# Patient Record
Sex: Female | Born: 1990
Health system: Southern US, Community
[De-identification: ages and names within clinical notes are randomized; demographics above are authoritative.]

## PROBLEM LIST (undated history)

## (undated) ENCOUNTER — Inpatient Hospital Stay (HOSPITAL_COMMUNITY): Payer: Self-pay

## (undated) DIAGNOSIS — F1910 Other psychoactive substance abuse, uncomplicated: Secondary | ICD-10-CM

## (undated) DIAGNOSIS — F1911 Other psychoactive substance abuse, in remission: Secondary | ICD-10-CM

## (undated) DIAGNOSIS — Z8489 Family history of other specified conditions: Secondary | ICD-10-CM

## (undated) DIAGNOSIS — N200 Calculus of kidney: Secondary | ICD-10-CM

## (undated) DIAGNOSIS — Z8614 Personal history of Methicillin resistant Staphylococcus aureus infection: Secondary | ICD-10-CM

## (undated) DIAGNOSIS — M21959 Unspecified acquired deformity of unspecified thigh: Secondary | ICD-10-CM

## (undated) HISTORY — PX: TONSILLECTOMY AND ADENOIDECTOMY: SHX28

## (undated) HISTORY — DX: Other psychoactive substance abuse, uncomplicated: F19.10

---
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
## 2012-12-23 DIAGNOSIS — M21959 Unspecified acquired deformity of unspecified thigh: Secondary | ICD-10-CM

## 2012-12-23 HISTORY — DX: Unspecified acquired deformity of unspecified thigh: M21.959

## 2012-12-25 ENCOUNTER — Encounter (HOSPITAL_BASED_OUTPATIENT_CLINIC_OR_DEPARTMENT_OTHER): Payer: Self-pay | Admitting: *Deleted

## 2013-01-01 ENCOUNTER — Encounter (HOSPITAL_BASED_OUTPATIENT_CLINIC_OR_DEPARTMENT_OTHER)
Admission: RE | Disposition: A | Discharge status: Home / Self Care | Payer: Self-pay | Source: Ambulatory Visit | Attending: Specialist

## 2013-01-01 ENCOUNTER — Encounter (HOSPITAL_BASED_OUTPATIENT_CLINIC_OR_DEPARTMENT_OTHER): Payer: BC Managed Care – PPO | Admitting: Anesthesiology

## 2013-01-01 ENCOUNTER — Ambulatory Visit (HOSPITAL_BASED_OUTPATIENT_CLINIC_OR_DEPARTMENT_OTHER): Payer: BC Managed Care – PPO | Admitting: Anesthesiology

## 2013-01-01 ENCOUNTER — Encounter (HOSPITAL_BASED_OUTPATIENT_CLINIC_OR_DEPARTMENT_OTHER): Payer: Self-pay | Admitting: Anesthesiology

## 2013-01-01 ENCOUNTER — Ambulatory Visit (HOSPITAL_BASED_OUTPATIENT_CLINIC_OR_DEPARTMENT_OTHER)
Admission: RE | Admit: 2013-01-01 | Discharge: 2013-01-01 | Disposition: A | Discharge status: Home / Self Care | Payer: BC Managed Care – PPO | Source: Ambulatory Visit | Attending: Specialist | Admitting: Specialist

## 2013-01-01 DIAGNOSIS — F1910 Other psychoactive substance abuse, uncomplicated: Secondary | ICD-10-CM | POA: Insufficient documentation

## 2013-01-01 DIAGNOSIS — L988 Other specified disorders of the skin and subcutaneous tissue: Secondary | ICD-10-CM | POA: Insufficient documentation

## 2013-01-01 DIAGNOSIS — Z8614 Personal history of Methicillin resistant Staphylococcus aureus infection: Secondary | ICD-10-CM | POA: Insufficient documentation

## 2013-01-01 HISTORY — DX: Other psychoactive substance abuse, in remission: F19.11

## 2013-01-01 HISTORY — DX: Unspecified acquired deformity of unspecified thigh: M21.959

## 2013-01-01 HISTORY — PX: LIPOSUCTION: SHX10

## 2013-01-01 HISTORY — DX: Family history of other specified conditions: Z84.89

## 2013-01-01 HISTORY — DX: Personal history of Methicillin resistant Staphylococcus aureus infection: Z86.14

## 2013-01-01 SURGERY — LIPOSUCTION
Anesthesia: General | Site: Hip | Laterality: Left | Wound class: Clean

## 2013-01-01 MED ORDER — MIDAZOLAM HCL 2 MG/2ML IJ SOLN
INTRAMUSCULAR | Status: AC
  Filled 2013-01-01: qty 2

## 2013-01-01 MED ORDER — LIDOCAINE-EPINEPHRINE 0.5 %-1:200000 IJ SOLN
INTRAMUSCULAR | Status: AC
  Filled 2013-01-01: qty 2

## 2013-01-01 MED ORDER — LIDOCAINE HCL (PF) 1 % IJ SOLN
INTRAMUSCULAR | Status: AC
  Filled 2013-01-01: qty 30

## 2013-01-01 MED ORDER — ONDANSETRON HCL 4 MG/2ML IJ SOLN: 4.0000 mg | Freq: Once | INTRAMUSCULAR | Status: DC | PRN

## 2013-01-01 MED ORDER — PROPOFOL 10 MG/ML IV BOLUS
INTRAVENOUS | Status: DC | PRN
  Administered 2013-01-01: 200 mg via INTRAVENOUS

## 2013-01-01 MED ORDER — LIDOCAINE-EPINEPHRINE 0.5 %-1:200000 IJ SOLN
INTRAMUSCULAR | Status: DC | PRN
  Administered 2013-01-01: 100 mL

## 2013-01-01 MED ORDER — LIDOCAINE HCL (PF) 0.5 % IJ SOLN
INTRAMUSCULAR | Status: AC
  Filled 2013-01-01: qty 50

## 2013-01-01 MED ORDER — FENTANYL CITRATE 0.05 MG/ML IJ SOLN: 50.0000 ug | INTRAMUSCULAR | Status: DC | PRN

## 2013-01-01 MED ORDER — DEXAMETHASONE SODIUM PHOSPHATE 4 MG/ML IJ SOLN
INTRAMUSCULAR | Status: DC | PRN
  Administered 2013-01-01: 10 mg via INTRAVENOUS

## 2013-01-01 MED ORDER — ONDANSETRON HCL 4 MG/2ML IJ SOLN
INTRAMUSCULAR | Status: DC | PRN
  Administered 2013-01-01: 4 mg via INTRAVENOUS

## 2013-01-01 MED ORDER — SODIUM CHLORIDE 0.9 % IV SOLN
INTRAVENOUS | Status: DC | PRN
  Administered 2013-01-01: 200 mL via INTRAMUSCULAR

## 2013-01-01 MED ORDER — MIDAZOLAM HCL 2 MG/ML PO SYRP: 12.0000 mg | ORAL_SOLUTION | Freq: Once | ORAL | Status: DC | PRN

## 2013-01-01 MED ORDER — LACTATED RINGERS IV SOLN
INTRAVENOUS | Status: DC
  Administered 2013-01-01: 07:00:00 via INTRAVENOUS

## 2013-01-01 MED ORDER — ACETAMINOPHEN 325 MG PO TABS
975.0000 mg | ORAL_TABLET | Freq: Once | ORAL | Status: AC
  Administered 2013-01-01: 975 mg via ORAL

## 2013-01-01 MED ORDER — MIDAZOLAM HCL 5 MG/5ML IJ SOLN
INTRAMUSCULAR | Status: DC | PRN
  Administered 2013-01-01: 2 mg via INTRAVENOUS

## 2013-01-01 MED ORDER — FENTANYL CITRATE 0.05 MG/ML IJ SOLN
INTRAMUSCULAR | Status: DC | PRN
  Administered 2013-01-01: 25 ug via INTRAVENOUS
  Administered 2013-01-01: 100 ug via INTRAVENOUS
  Administered 2013-01-01: 25 ug via INTRAVENOUS

## 2013-01-01 MED ORDER — CEFAZOLIN SODIUM 1-5 GM-% IV SOLN
INTRAVENOUS | Status: AC
  Filled 2013-01-01: qty 100

## 2013-01-01 MED ORDER — HYDROMORPHONE HCL PF 1 MG/ML IJ SOLN: 0.2500 mg | INTRAMUSCULAR | Status: DC | PRN

## 2013-01-01 MED ORDER — OXYCODONE HCL 5 MG PO TABS: 5.0000 mg | ORAL_TABLET | Freq: Once | ORAL | Status: DC | PRN

## 2013-01-01 MED ORDER — FENTANYL CITRATE 0.05 MG/ML IJ SOLN
INTRAMUSCULAR | Status: AC
  Filled 2013-01-01: qty 4

## 2013-01-01 MED ORDER — MIDAZOLAM HCL 2 MG/2ML IJ SOLN: 1.0000 mg | INTRAMUSCULAR | Status: DC | PRN

## 2013-01-01 MED ORDER — LIDOCAINE HCL (CARDIAC) 20 MG/ML IV SOLN
INTRAVENOUS | Status: DC | PRN
  Administered 2013-01-01: 80 mg via INTRAVENOUS

## 2013-01-01 MED ORDER — ACETAMINOPHEN 325 MG PO TABS
ORAL_TABLET | ORAL | Status: AC
  Filled 2013-01-01: qty 3

## 2013-01-01 MED ORDER — OXYCODONE HCL 5 MG/5ML PO SOLN: 5.0000 mg | Freq: Once | ORAL | Status: DC | PRN

## 2013-01-01 MED ORDER — CEFAZOLIN SODIUM-DEXTROSE 2-3 GM-% IV SOLR
2.0000 g | INTRAVENOUS | Status: AC
  Administered 2013-01-01: 2 g via INTRAVENOUS

## 2013-01-01 SURGICAL SUPPLY — 61 items
BAG DECANTER FOR FLEXI CONT (MISCELLANEOUS) IMPLANT
BANDAGE GAUZE ELAST BULKY 4 IN (GAUZE/BANDAGES/DRESSINGS) IMPLANT
BENZOIN TINCTURE PRP APPL 2/3 (GAUZE/BANDAGES/DRESSINGS) IMPLANT
BINDER ABD UNIV 9 30-45 (GAUZE/BANDAGES/DRESSINGS) ×1 IMPLANT
BINDER ABDOMINAL 9 (GAUZE/BANDAGES/DRESSINGS) ×2
BLADE HEX COATED 2.75 (ELECTRODE) IMPLANT
BLADE KNIFE PERSONA 10 (BLADE) ×2 IMPLANT
BLADE KNIFE PERSONA 15 (BLADE) ×2 IMPLANT
BNDG COHESIVE 4X5 TAN STRL (GAUZE/BANDAGES/DRESSINGS) IMPLANT
CANISTER LINER 1300 C W/ELBOW (MISCELLANEOUS) ×2 IMPLANT
CANISTER LIPO FAT HARVEST (MISCELLANEOUS) ×2 IMPLANT
CANNULA ASPIRATION (CANNULA) ×2 IMPLANT
CLEANER CAUTERY TIP 5X5 PAD (MISCELLANEOUS) IMPLANT
COVER MAYO STAND STRL (DRAPES) ×4 IMPLANT
COVER TABLE BACK 60X90 (DRAPES) ×2 IMPLANT
DECANTER SPIKE VIAL GLASS SM (MISCELLANEOUS) IMPLANT
DRAPE PED LAPAROTOMY (DRAPES) IMPLANT
DRAPE U-SHAPE 76X120 STRL (DRAPES) IMPLANT
ELECT NEEDLE TIP 2.8 STRL (NEEDLE) IMPLANT
ELECT REM PT RETURN 9FT ADLT (ELECTROSURGICAL) ×2
ELECTRODE REM PT RTRN 9FT ADLT (ELECTROSURGICAL) ×1 IMPLANT
FILTER LIPOSUCTION (MISCELLANEOUS) ×2 IMPLANT
GAUZE SPONGE 4X4 12PLY STRL LF (GAUZE/BANDAGES/DRESSINGS) IMPLANT
GAUZE XEROFORM 1X8 LF (GAUZE/BANDAGES/DRESSINGS) IMPLANT
GLOVE ECLIPSE 7.0 STRL STRAW (GLOVE) ×2 IMPLANT
GOWN PREVENTION PLUS XLARGE (GOWN DISPOSABLE) IMPLANT
GOWN PREVENTION PLUS XXLARGE (GOWN DISPOSABLE) ×4 IMPLANT
IV LACTATED RINGERS 1000ML (IV SOLUTION) IMPLANT
IV NS 500ML (IV SOLUTION)
IV NS 500ML BAXH (IV SOLUTION) IMPLANT
NDL SAFETY ECLIPSE 18X1.5 (NEEDLE) IMPLANT
NEEDLE HYPO 18GX1.5 SHARP (NEEDLE)
NEEDLE HYPO 25X1 1.5 SAFETY (NEEDLE) IMPLANT
NEEDLE SPNL 18GX3.5 QUINCKE PK (NEEDLE) ×2 IMPLANT
PACK BASIN DAY SURGERY FS (CUSTOM PROCEDURE TRAY) ×2 IMPLANT
PAD CLEANER CAUTERY TIP 5X5 (MISCELLANEOUS)
PENCIL BUTTON HOLSTER BLD 10FT (ELECTRODE) IMPLANT
SHEET MEDIUM DRAPE 40X70 STRL (DRAPES) IMPLANT
SHEETING SILICONE GEL EPI DERM (MISCELLANEOUS) IMPLANT
SLEEVE SCD COMPRESS KNEE MED (MISCELLANEOUS) ×2 IMPLANT
SPONGE GAUZE 4X4 12PLY (GAUZE/BANDAGES/DRESSINGS) IMPLANT
SPONGE LAP 18X18 X RAY DECT (DISPOSABLE) IMPLANT
STOCKINETTE 4X48 STRL (DRAPES) IMPLANT
STOCKINETTE IMPERVIOUS LG (DRAPES) IMPLANT
STRIP CLOSURE SKIN 1/8X3 (GAUZE/BANDAGES/DRESSINGS) IMPLANT
STRIP SUTURE WOUND CLOSURE 1/2 (SUTURE) IMPLANT
SUT ETHILON 3 0 PS 1 (SUTURE) IMPLANT
SUT ETHILON 5 0 PS 2 18 (SUTURE) ×2 IMPLANT
SUT MNCRL AB 3-0 PS2 18 (SUTURE) IMPLANT
SUT MON AB 2-0 CT1 36 (SUTURE) IMPLANT
SUT MON AB 5-0 PS2 18 (SUTURE) IMPLANT
SUT PROLENE 4 0 PS 2 18 (SUTURE) IMPLANT
SUT VIC AB 0 CT1 27 (SUTURE)
SUT VIC AB 0 CT1 27XBRD ANBCTR (SUTURE) IMPLANT
SYR 20CC LL (SYRINGE) ×4 IMPLANT
SYR 50ML LL SCALE MARK (SYRINGE) ×4 IMPLANT
SYR CONTROL 10ML LL (SYRINGE) IMPLANT
SYRINGE TOOMEY DISP (SYRINGE) ×2 IMPLANT
TOWEL OR 17X24 6PK STRL BLUE (TOWEL DISPOSABLE) ×4 IMPLANT
TUBING SET GRADUATE ASPIR 12FT (MISCELLANEOUS) ×2 IMPLANT
UNDERPAD 30X30 INCONTINENT (UNDERPADS AND DIAPERS) IMPLANT

## 2013-01-01 NOTE — Brief Op Note (Signed)
01/01/2013  9:06 AM  PATIENT:  Dana Gonzales  22 y.o. female  PRE-OPERATIVE DIAGNOSIS:  Unspecified acquired deformity of hip  POST-OPERATIVE DIAGNOSIS:  Unspecified acquired deformity of hip  PROCEDURE:  Procedure(s) with comments: RECONSTRUCTION OF THE LEFT HIP DEFECT WITH LIPOSUCTION ASSISTANCE (Left) - Fat injection left hip  SURGEON:  Surgeon(s) and Role:    * Louisa Second, MD - Primary  PHYSICIAN ASSISTANT:   ASSISTANTS: none   ANESTHESIA:   general  EBL:  Total I/O In: 1300 [I.V.:1300] Out: -   BLOOD ADMINISTERED:none  DRAINS: none   LOCAL MEDICATIONS USED:  XYLOCAINE   SPECIMEN:  No Specimen  DISPOSITION OF SPECIMEN:  N/A  COUNTS:  YES  TOURNIQUET:  * No tourniquets in log *  DICTATION: .Other Dictation: Dictation Number [id number]  PLAN OF CARE: Discharge to home after PACU  PATIENT DISPOSITION:  PACU - hemodynamically stable.   Delay start of Pharmacological VTE agent (>24hrs) due to surgical blood loss or risk of bleeding: yes

## 2013-01-01 NOTE — Anesthesia Postprocedure Evaluation (Signed)
  Anesthesia Post-op Note  Patient: Dana Gonzales  Procedure(s) Performed: Procedure(s) with comments: RECONSTRUCTION OF THE LEFT HIP DEFECT WITH LIPOSUCTION ASSISTANCE (Left) - Fat injection left hip  Patient Location: PACU  Anesthesia Type:General  Level of Consciousness: awake, alert  and oriented  Airway and Oxygen Therapy: Patient Spontanous Breathing  Post-op Pain: mild  Post-op Assessment: Post-op Vital signs reviewed  Post-op Vital Signs: Reviewed  Complications: No apparent anesthesia complications

## 2013-01-01 NOTE — Transfer of Care (Signed)
Immediate Anesthesia Transfer of Care Note  Patient: Dana Gonzales  Procedure(s) Performed: Procedure(s) with comments: RECONSTRUCTION OF THE LEFT HIP DEFECT WITH LIPOSUCTION ASSISTANCE (Left) - Fat injection left hip  Patient Location: PACU  Anesthesia Type:General  Level of Consciousness: awake, alert  and oriented  Airway & Oxygen Therapy: Patient Spontanous Breathing and Patient connected to face mask oxygen  Post-op Assessment: Report given to PACU RN and Post -op Vital signs reviewed and stable  Post vital signs: Reviewed and stable  Complications: No apparent anesthesia complications

## 2013-01-01 NOTE — Anesthesia Procedure Notes (Signed)
Procedure Name: LMA Insertion Performed by: Maliah Pyles W Pre-anesthesia Checklist: Patient identified, Timeout performed, Emergency Drugs available, Suction available and Patient being monitored Patient Re-evaluated:Patient Re-evaluated prior to inductionOxygen Delivery Method: Circle system utilized Preoxygenation: Pre-oxygenation with 100% oxygen Intubation Type: IV induction Ventilation: Mask ventilation without difficulty LMA: LMA inserted LMA Size: 4.0 Number of attempts: 1 Placement Confirmation: breath sounds checked- equal and bilateral and positive ETCO2 Dental Injury: Teeth and Oropharynx as per pre-operative assessment      

## 2013-01-01 NOTE — Anesthesia Preprocedure Evaluation (Addendum)
Anesthesia Evaluation  Patient identified by MRN, date of birth, ID band Patient awake    Reviewed: Allergy & Precautions, H&P , NPO status , Patient's Chart, lab work & pertinent test results  Airway Mallampati: I TM Distance: >3 FB Neck ROM: Full    Dental  (+) Teeth Intact and Dental Advisory Given   Pulmonary  breath sounds clear to auscultation        Cardiovascular Rhythm:Regular Rate:Normal     Neuro/Psych    GI/Hepatic   Endo/Other    Renal/GU      Musculoskeletal   Abdominal   Peds  Hematology   Anesthesia Other Findings Hx of narcotic addiction.  Fearful of being narcotics even for acute pain control.  Reproductive/Obstetrics                          Anesthesia Physical Anesthesia Plan  ASA: I  Anesthesia Plan: General   Post-op Pain Management:    Induction: Intravenous  Airway Management Planned: Oral ETT  Additional Equipment:   Intra-op Plan:   Post-operative Plan: Extubation in OR  Informed Consent: I have reviewed the patients History and Physical, chart, labs and discussed the procedure including the risks, benefits and alternatives for the proposed anesthesia with the patient or authorized representative who has indicated his/her understanding and acceptance.   Dental advisory given  Plan Discussed with: CRNA, Anesthesiologist and Surgeon  Anesthesia Plan Comments: (I explained to both the patient and her mother that the use of narcotics post operatively, especially in the acute setting.)       Anesthesia Quick Evaluation

## 2013-01-01 NOTE — H&P (Signed)
Dana Gonzales is an 22 y.o. female.   Chief Complaint: Defect left hip areaHPI:P INJECTION OF STEROID MEdications to left hiop for medical reasons Left a severe hollow defect that is very deformative  Past Medical History  Diagnosis Date  . History of substance abuse     opiates - has been "clean" x 7 mos., per mother  . History of MRSA infection 2007    face  . Hip deformity, acquired 12/2012    left  . Family history of anesthesia complication     pt's mother has hx. of post-op N/V    Past Surgical History  Procedure Laterality Date  . Tonsillectomy and adenoidectomy      Family History  Problem Relation Age of Onset  . Anesthesia problems Mother     post-op N/V   Social History:  reports that she has been smoking Cigarettes.  She has a 1.5 pack-year smoking history. She has never used smokeless tobacco. She reports that she does not drink alcohol or use illicit drugs.  Allergies:  Allergies  Allergen Reactions  . Macrobid [Nitrofurantoin Macrocrystal] Nausea And Vomiting  . Ciprofloxacin Hcl Nausea And Vomiting    Medications Prior to Admission  Medication Sig Dispense Refill  . norelgestromin-ethinyl estradiol (ORTHO EVRA) 150-35 MCG/24HR transdermal patch Place 1 patch onto the skin once a week.        No results found for this or any previous visit (from the past 48 hour(s)). No results found.  Review of Systems  Constitutional: Negative.   HENT: Negative.   Eyes: Negative.   Respiratory: Negative.   Cardiovascular: Negative.   Gastrointestinal: Negative.   Genitourinary: Negative.   Musculoskeletal: Negative.   Skin: Negative.   Neurological: Negative.   Endo/Heme/Allergies: Negative.   Psychiatric/Behavioral: Negative.     Blood pressure 99/64, pulse 71, temperature 97.6 F (36.4 C), temperature source Oral, resp. rate 16, height 5\' 2"  (1.575 m), weight 54.885 kg (121 lb), last menstrual period 12/18/2012, SpO2 99.00%. Physical Exam    Assessment/Plan Defect left hip area for reconstruction with fat injectyions and possible fat dermal fillings  Melinda Gwinner L 01/01/2013, 7:36 AM

## 2013-01-02 ENCOUNTER — Encounter (HOSPITAL_BASED_OUTPATIENT_CLINIC_OR_DEPARTMENT_OTHER): Payer: Self-pay | Admitting: Specialist

## 2013-01-03 NOTE — Op Note (Signed)
NAME:  Dana Gonzales, Dana Gonzales          ACCOUNT NO.:  Visit Information  MEDICAL RECORD NO.:  [id number]  LOCATION:                               FACILITY:  [facility]  PHYSICIAN:  Earvin Hansen L. Stephenia Vogan, M.D.DATE OF BIRTH:  10-04-1990  DATE OF PROCEDURE:  01/01/2013 DATE OF DISCHARGE:  01/01/2013                              OPERATIVE REPORT   A 22 year old who had a very serious incident with poison ivy, had intense inflammatory reactions, and received a steroid injection to cool off symptoms causing a severe depression involving her left hip area. The area has not gotten any better over time and has not regenerated, and now presents with a defect approximately 8-10 cm x 8-10 cm in width and length as well as a deepened area approximately another 6 cm or more.  PROCEDURE IN DETAIL:  All procedures in detail as well as attendant risks were explained to the patient preoperatively using reconstruction __________ and all the alternatives were explained to the patient.  The patient consents to reconstruction of the area with reimplantation. Anesthesia is general.  The patient underwent drawings of the area preoperatively as well as areas of lower abdomen.  She underwent general anesthesia and intubated orally.  Prep was done to the abdomen, hip, and thigh areas using Hibiclens soap and solution, walled off with sterile towels and drapes so as to make a sterile field.  Tumescent solution was injected locally in the lower abdomen.  This was allowed to sit up for about 10-15 minutes.  Next, the liposuction of the lower abdomen using a [place] catheter __________ removing approximately 300 mL of fatty material.  This was drained off and __________ and then the fat was then injected into the defect using a small sprinkler system and then approximately 25% flat saline ingestion.  The incision areas were then re-closed with 5-0 nylon.  Sterile drapes and 4x4s were applied to all the areas of abdominal  support.  She tolerated the procedures very well. Estimated blood loss was nil.  Then taken to recovery in excellent condition.  Vital signs stable.     Yaakov Guthrie. Shon Hough, M.D.     Cathie Hoops  D:  01/01/2013  T:  01/02/2013  Job:  [id number]

## 2013-02-06 ENCOUNTER — Encounter: Payer: Self-pay | Admitting: Certified Nurse Midwife

## 2013-02-06 ENCOUNTER — Ambulatory Visit (INDEPENDENT_AMBULATORY_CARE_PROVIDER_SITE_OTHER): Payer: BC Managed Care – PPO | Admitting: Certified Nurse Midwife

## 2013-02-06 VITALS — BP 90/60 | HR 64 | Resp 16 | Ht 63.0 in | Wt 117.0 lb

## 2013-02-06 DIAGNOSIS — R894 Abnormal immunological findings in specimens from other organs, systems and tissues: Secondary | ICD-10-CM

## 2013-02-06 DIAGNOSIS — Z01419 Encounter for gynecological examination (general) (routine) without abnormal findings: Secondary | ICD-10-CM

## 2013-02-06 DIAGNOSIS — Z309 Encounter for contraceptive management, unspecified: Secondary | ICD-10-CM

## 2013-02-06 DIAGNOSIS — Z0000 Encounter for general adult medical examination without abnormal findings: Secondary | ICD-10-CM

## 2013-02-06 DIAGNOSIS — R768 Other specified abnormal immunological findings in serum: Secondary | ICD-10-CM | POA: Insufficient documentation

## 2013-02-06 MED ORDER — NORELGESTROMIN-ETH ESTRADIOL 150-35 MCG/24HR TD PTWK: 1.0000 | MEDICATED_PATCH | TRANSDERMAL | Status: DC

## 2013-02-06 NOTE — Progress Notes (Signed)
Reviewed personally.  M. Suzanne Neo Yepiz, MD.  

## 2013-02-06 NOTE — Patient Instructions (Signed)
General topics  Next pap or exam is  due in 1 year Take a Women's multivitamin Take 1200 mg. of calcium daily - prefer dietary If any concerns in interim to call back  Breast Self-Awareness Practicing breast self-awareness may pick up problems early, prevent significant medical complications, and possibly save your life. By practicing breast self-awareness, you can become familiar with how your breasts look and feel and if your breasts are changing. This allows you to notice changes early. It can also offer you some reassurance that your breast health is good. One way to learn what is normal for your breasts and whether your breasts are changing is to do a breast self-exam. If you find a lump or something that was not present in the past, it is best to contact your caregiver right away. Other findings that should be evaluated by your caregiver include nipple discharge, especially if it is bloody; skin changes or reddening; areas where the skin seems to be pulled in (retracted); or new lumps and bumps. Breast pain is seldom associated with cancer (malignancy), but should also be evaluated by a caregiver. BREAST SELF-EXAM The best time to examine your breasts is 5 7 days after your menstrual period is over.  ExitCare Patient Information 2013 [employer].   Exercise to Stay Healthy Exercise helps you become and stay healthy. EXERCISE IDEAS AND TIPS Choose exercises that:  You enjoy.  Fit into your day. You do not need to exercise really hard to be healthy. You can do exercises at a slow or medium level and stay healthy. You can:  Stretch before and after working out.  Try yoga, Pilates, or tai chi.  Lift weights.  Walk fast, swim, jog, run, climb stairs, bicycle, dance, or rollerskate.  Take aerobic classes. Exercises that burn about 150 calories:  Running 1  miles in 15 minutes.  Playing volleyball for 45 to 60 minutes.  Washing and waxing a car for 45 to 60  minutes.  Playing touch football for 45 minutes.  Walking 1  miles in 35 minutes.  Pushing a stroller 1  miles in 30 minutes.  Playing basketball for 30 minutes.  Raking leaves for 30 minutes.  Bicycling 5 miles in 30 minutes.  Walking 2 miles in 30 minutes.  Dancing for 30 minutes.  Shoveling snow for 15 minutes.  Swimming laps for 20 minutes.  Walking up stairs for 15 minutes.  Bicycling 4 miles in 15 minutes.  Gardening for 30 to 45 minutes.  Jumping rope for 15 minutes.  Washing windows or floors for 45 to 60 minutes. Document Released: 03/13/2010 Document Revised: 05/03/2011 Document Reviewed: 03/13/2010 ExitCare Patient Information 2013 [employer].   Other topics ( that may be useful information):    Sexually Transmitted Disease Sexually transmitted disease (STD) refers to any infection that is passed from person to person during sexual activity. This may happen by way of saliva, semen, blood, vaginal mucus, or urine. Common STDs include:  Gonorrhea.  Chlamydia.  Syphilis.  HIV/AIDS.  Genital herpes.  Hepatitis B and C.  Trichomonas.  Human papillomavirus (HPV).  Pubic lice. CAUSES  An STD may be spread by bacteria, virus, or parasite. A person can get an STD by:  Sexual intercourse with an infected person.  Sharing sex toys with an infected person.  Sharing needles with an infected person.  Having intimate contact with the genitals, mouth, or rectal areas of an infected person. SYMPTOMS  Some people may not have any symptoms, but   they can still pass the infection to others. Different STDs have different symptoms. Symptoms include:  Painful or bloody urination.  Pain in the pelvis, abdomen, vagina, anus, throat, or eyes.  Skin rash, itching, irritation, growths, or sores (lesions). These usually occur in the genital or anal area.  Abnormal vaginal discharge.  Penile discharge in men.  Soft, flesh-colored skin growths in the  genital or anal area.  Fever.  Pain or bleeding during sexual intercourse.  Swollen glands in the groin area.  Yellow skin and eyes (jaundice). This is seen with hepatitis. DIAGNOSIS  To make a diagnosis, your caregiver may:  Take a medical history.  Perform a physical exam.  Take a specimen (culture) to be examined.  Examine a sample of discharge under a microscope.  Perform blood test TREATMENT   Chlamydia, gonorrhea, trichomonas, and syphilis can be cured with antibiotic medicine.  Genital herpes, hepatitis, and HIV can be treated, but not cured, with prescribed medicines. The medicines will lessen the symptoms.  Genital warts from HPV can be treated with medicine or by freezing, burning (electrocautery), or surgery. Warts may come back.  HPV is a virus and cannot be cured with medicine or surgery.However, abnormal areas may be followed very closely by your caregiver and may be removed from the cervix, vagina, or vulva through office procedures or surgery. If your diagnosis is confirmed, your recent sexual partners need treatment. This is true even if they are symptom-free or have a negative culture or evaluation. They should not have sex until their caregiver says it is okay. HOME CARE INSTRUCTIONS  All sexual partners should be informed, tested, and treated for all STDs.  Take your antibiotics as directed. Finish them even if you start to feel better.  Only take over-the-counter or prescription medicines for pain, discomfort, or fever as directed by your caregiver.  Rest.  Eat a balanced diet and drink enough fluids to keep your urine clear or pale yellow.  Do not have sex until treatment is completed and you have followed up with your caregiver. STDs should be checked after treatment.  Keep all follow-up appointments, Pap tests, and blood tests as directed by your caregiver.  Only use latex condoms and water-soluble lubricants during sexual activity. Do not use  petroleum jelly or oils.  Avoid alcohol and illegal drugs.  Get vaccinated for HPV and hepatitis. If you have not received these vaccines in the past, talk to your caregiver about whether one or both might be right for you.  Avoid risky sex practices that can break the skin. The only way to avoid getting an STD is to avoid all sexual activity.Latex condoms and dental dams (for oral sex) will help lessen the risk of getting an STD, but will not completely eliminate the risk. SEEK MEDICAL CARE IF:   You have a fever.  You have any new or worsening symptoms. Document Released: 05/01/2002 Document Revised: 05/03/2011 Document Reviewed: 05/08/2010 ExitCare Patient Information 2013 [employer].    Domestic Abuse You are being battered or abused if someone close to you hits, pushes, or physically hurts you in any way. You also are being abused if you are forced into activities. You are being sexually abused if you are forced to have sexual contact of any kind. You are being emotionally abused if you are made to feel worthless or if you are constantly threatened. It is important to remember that help is available. No one has the right to abuse you. PREVENTION OF FURTHER   ABUSE  Learn the warning signs of danger. This varies with situations but may include: the use of alcohol, threats, isolation from friends and family, or forced sexual contact. Leave if you feel that violence is going to occur.  If you are attacked or beaten, report it to the police so the abuse is documented. You do not have to press charges. The police can protect you while you or the attackers are leaving. Get the officer's name and badge number and a copy of the report.  Find someone you can trust and tell them what is happening to you: your caregiver, a nurse, clergy member, close friend or family member. Feeling ashamed is natural, but remember that you have done nothing wrong. No one deserves abuse. Document Released:  02/06/2000 Document Revised: 05/03/2011 Document Reviewed: 04/16/2010 ExitCare Patient Information 2013 [employer].    How Much is Too Much Alcohol? Drinking too much alcohol can cause injury, accidents, and health problems. These types of problems can include:   Car crashes.  Falls.  Family fighting (domestic violence).  Drowning.  Fights.  Injuries.  Burns.  Damage to certain organs.  Having a baby with birth defects. ONE DRINK CAN BE TOO MUCH WHEN YOU ARE:  Working.  Pregnant or breastfeeding.  Taking medicines. Ask your doctor.  Driving or planning to drive. If you or someone you know has a drinking problem, get help from a doctor.  Document Released: 12/05/2008 Document Revised: 05/03/2011 Document Reviewed: 12/05/2008 ExitCare Patient Information 2013 [employer].   Smoking Hazards Smoking cigarettes is extremely bad for your health. Tobacco smoke has over 200 known poisons in it. There are over 60 chemicals in tobacco smoke that cause cancer. Some of the chemicals found in cigarette smoke include:   Cyanide.  Benzene.  Formaldehyde.  Methanol (wood alcohol).  Acetylene (fuel used in welding torches).  Ammonia. Cigarette smoke also contains the poisonous gases nitrogen oxide and carbon monoxide.  Cigarette smokers have an increased risk of many serious medical problems and Smoking causes approximately:  90% of all lung cancer deaths in men.  80% of all lung cancer deaths in women.  90% of deaths from chronic obstructive lung disease. Compared with nonsmokers, smoking increases the risk of:  Coronary heart disease by 2 to 4 times.  Stroke by 2 to 4 times.  Men developing lung cancer by 23 times.  Women developing lung cancer by 13 times.  Dying from chronic obstructive lung diseases by 12 times.  . Smoking is the most preventable cause of death and disease in our society.  WHY IS SMOKING ADDICTIVE?  Nicotine is the chemical  agent in tobacco that is capable of causing addiction or dependence.  When you smoke and inhale, nicotine is absorbed rapidly into the bloodstream through your lungs. Nicotine absorbed through the lungs is capable of creating a powerful addiction. Both inhaled and non-inhaled nicotine may be addictive.  Addiction studies of cigarettes and spit tobacco show that addiction to nicotine occurs mainly during the teen years, when young people begin using tobacco products. WHAT ARE THE BENEFITS OF QUITTING?  There are many health benefits to quitting smoking.   Likelihood of developing cancer and heart disease decreases. Health improvements are seen almost immediately.  Blood pressure, pulse rate, and breathing patterns start returning to normal soon after quitting. QUITTING SMOKING   American Lung Association - 1-800-LUNGUSA  American Cancer Society - 1-800-ACS-2345 Document Released: 03/18/2004 Document Revised: 05/03/2011 Document Reviewed: 11/20/2008 ExitCare Patient Information 2013 [employer].   Stress Management Stress is a state of physical or mental tension that often results from changes in your life or normal routine. Some common causes of stress are:  Death of a loved one.  Injuries or severe illnesses.  Getting fired or changing jobs.  Moving into a new home. Other causes may be:  Sexual problems.  Business or financial losses.  Taking on a large debt.  Regular conflict with someone at home or at work.  Constant tiredness from lack of sleep. It is not just bad things that are stressful. It may be stressful to:  Win the lottery.  Get married.  Buy a new car. The amount of stress that can be easily tolerated varies from person to person. Changes generally cause stress, regardless of the types of change. Too much stress can affect your health. It may lead to physical or emotional problems. Too little stress (boredom) may also become stressful. SUGGESTIONS TO  REDUCE STRESS:  Talk things over with your family and friends. It often is helpful to share your concerns and worries. If you feel your problem is serious, you may want to get help from a professional counselor.  Consider your problems one at a time instead of lumping them all together. Trying to take care of everything at once may seem impossible. List all the things you need to do and then start with the most important one. Set a goal to accomplish 2 or 3 things each day. If you expect to do too many in a single day you will naturally fail, causing you to feel even more stressed.  Do not use alcohol or drugs to relieve stress. Although you may feel better for a short time, they do not remove the problems that caused the stress. They can also be habit forming.  Exercise regularly - at least 3 times per week. Physical exercise can help to relieve that "uptight" feeling and will relax you.  The shortest distance between despair and hope is often a good night's sleep.  Go to bed and get up on time allowing yourself time for appointments without being rushed.  Take a short "time-out" period from any stressful situation that occurs during the day. Close your eyes and take some deep breaths. Starting with the muscles in your face, tense them, hold it for a few seconds, then relax. Repeat this with the muscles in your neck, shoulders, hand, stomach, back and legs.  Take good care of yourself. Eat a balanced diet and get plenty of rest.  Schedule time for having fun. Take a break from your daily routine to relax. HOME CARE INSTRUCTIONS   Call if you feel overwhelmed by your problems and feel you can no longer manage them on your own.  Return immediately if you feel like hurting yourself or someone else. Document Released: 08/04/2000 Document Revised: 05/03/2011 Document Reviewed: 03/27/2007 ExitCare Patient Information 2013 [employer].   

## 2013-02-06 NOTE — Progress Notes (Signed)
22 y.o. G0P0000 Single Caucasian Fe here for annual exam. Periods normal and regular. Previous Heroin addict and alcoholic still in rehab. Desires STD screening of Gc, Chlamydia and Herpes. Positive for Heptatis C antibodies under follow up with [facility].  History of Chronic UTI, last infection 6/14.  Patient's last menstrual period was 01/18/2013.          Sexually active: yes  The current method of family planning is Ortho-Evra patches weekly.    Exercising: yes  cardio Smoker:  yes  Health Maintenance: Pap:  2013 neg No abnormal paps MMG:  none Colonoscopy:  none BMD:   none TDaP: 2009 Labs: none Self breast exam: not done   reports that she has been smoking Cigarettes.  She has a 6 pack-year smoking history. She does not have any smokeless tobacco history on file. She reports that she does not drink alcohol or use illicit drugs.  Past Medical History  Diagnosis Date  . History of substance abuse     opiates - has been "clean" x 7 mos., per mother  . History of MRSA infection 2007    face  . Hip deformity, acquired 12/2012    left  . Family history of anesthesia complication     pt's mother has hx. of post-op N/V  . Substance abuse     opiates-past    Past Surgical History  Procedure Laterality Date  . Tonsillectomy and adenoidectomy    . Liposuction Left 01/01/2013    Procedure: RECONSTRUCTION OF THE LEFT HIP DEFECT WITH LIPOSUCTION ASSISTANCE;  Surgeon: Louisa Second, MD;  Location: [facility];  Service: Plastics;  Laterality: Left;  Fat injection left hip    Current Outpatient Prescriptions  Medication Sig Dispense Refill  . norelgestromin-ethinyl estradiol (ORTHO EVRA) 150-35 MCG/24HR transdermal patch Place 1 patch onto the skin once a week.       No current facility-administered medications for this visit.    Family History  Problem Relation Age of Onset  . Anesthesia problems Mother     post-op N/V  . Thyroid disease Mother   .  Cancer Maternal Grandmother     lung  . Diabetes Paternal Grandfather     ROS:  Pertinent items are noted in HPI.  Otherwise, a comprehensive ROS was negative.  Exam:   BP 90/60  Pulse 64  Resp 16  Ht 5\' 3"  (1.6 m)  Wt 117 lb (53.071 kg)  BMI 20.73 kg/m2  LMP 01/18/2013 Height: 5\' 3"  (160 cm)  Ht Readings from Last 3 Encounters:  02/06/13 5\' 3"  (1.6 m)  01/01/13 5\' 2"  (1.575 m)  01/01/13 5\' 2"  (1.575 m)    General appearance: alert, cooperative and appears stated age Head: Normocephalic, without obvious abnormality, atraumatic Neck: no adenopathy, supple, symmetrical, trachea midline and thyroid normal to inspection and palpation and non-palpable Lungs: clear to auscultation bilaterally Breasts: normal appearance, no masses or tenderness, No nipple retraction or dimpling, No nipple discharge or bleeding, No axillary or supraclavicular adenopathy, Taught monthly breast self examination Heart: regular rate and rhythm Abdomen: soft, non-tender; no masses,  no organomegaly Extremities: extremities normal, atraumatic, no cyanosis or edema Skin: Skin color, texture, turgor normal. No rashes or lesions Lymph nodes: Cervical, supraclavicular, and axillary nodes normal. No abnormal inguinal nodes palpated Neurologic: Grossly normal   Pelvic: External genitalia:  no lesions              Urethra:  normal appearing urethra with no masses, tenderness or lesions  Bartholin's and Skene's: normal                 Vagina: normal appearing vagina with normal color and discharge, no lesions              Cervix: normal, non tender              Pap taken: no Bimanual Exam:  Uterus:  normal size, contour, position, consistency, mobility, non-tender and anteverted              Adnexa: normal adnexa and no mass, fullness, tenderness               Rectovaginal: Confirms               Anus:  normal sphincter tone, no lesions  A:  Well Woman with normal exam  Contraception Ortho Evra  desires  Hepatitis C with positive antibodies under follow up  Previous Heroin addict and alcoholic in rehab, clean x 6 months, [facility] support  STD screening  Gardasil ? incomplete series  P:   Reviewed health and wellness pertinent to exam  Rx Ortho Evra see order  Continue follow up as indicated with MD  Congratulated on making decision to be in rehab and encouraged to continue  Lab: Gc,chlamydia,HSV1,11  Will request records from PCP and advise  Pap smear as per guidelines   pap smear not taken today  return annually or prn  An After Visit Summary was printed and given to the patient.

## 2013-02-07 LAB — HSV(HERPES SIMPLEX VRS) I + II AB-IGG: HSV 1 Glycoprotein G Ab, IgG: 5.71 IV — ABNORMAL HIGH

## 2013-02-08 ENCOUNTER — Telehealth: Payer: Self-pay

## 2013-02-08 LAB — IPS N GONORRHOEA AND CHLAMYDIA BY PCR

## 2013-02-08 NOTE — Telephone Encounter (Signed)
lmtcb

## 2013-02-08 NOTE — Telephone Encounter (Signed)
Message copied by Eliezer Bottom on Thu Feb 08, 2013  4:16 PM ------      Message from: Verner Chol      Created: Thu Feb 08, 2013  3:17 PM       Notify GC, chlamydia negative      Herpes 1 is positive and we discussed if she had oral mouth sores it may a type of HSV and would come back positive      HSV 2 negative ------

## 2013-02-13 NOTE — Telephone Encounter (Signed)
Left message for call back.

## 2013-02-19 NOTE — Telephone Encounter (Signed)
Left message to call back  

## 2013-02-21 NOTE — Telephone Encounter (Signed)
Left message for call back.

## 2013-02-26 NOTE — Telephone Encounter (Signed)
Left message to call back  

## 2013-02-28 NOTE — Telephone Encounter (Signed)
Patient returning Joy's call.  °

## 2013-03-01 NOTE — Telephone Encounter (Signed)
Patient notified of results.

## 2013-03-01 NOTE — Telephone Encounter (Signed)
Left message to call back  

## 2014-02-07 ENCOUNTER — Encounter: Payer: Self-pay | Admitting: Certified Nurse Midwife

## 2014-02-07 ENCOUNTER — Ambulatory Visit: Payer: BC Managed Care – PPO | Admitting: Certified Nurse Midwife

## 2014-06-14 ENCOUNTER — Encounter (HOSPITAL_COMMUNITY): Payer: Self-pay

## 2014-06-14 ENCOUNTER — Emergency Department (HOSPITAL_COMMUNITY)
Admission: EM | Admit: 2014-06-14 | Discharge: 2014-06-14 | Disposition: A | Discharge status: Home / Self Care | Payer: 59 | Attending: Emergency Medicine | Admitting: Emergency Medicine

## 2014-06-14 DIAGNOSIS — Z87442 Personal history of urinary calculi: Secondary | ICD-10-CM | POA: Insufficient documentation

## 2014-06-14 DIAGNOSIS — Z8619 Personal history of other infectious and parasitic diseases: Secondary | ICD-10-CM | POA: Insufficient documentation

## 2014-06-14 DIAGNOSIS — Z3202 Encounter for pregnancy test, result negative: Secondary | ICD-10-CM | POA: Insufficient documentation

## 2014-06-14 DIAGNOSIS — R55 Syncope and collapse: Secondary | ICD-10-CM | POA: Diagnosis not present

## 2014-06-14 DIAGNOSIS — Z720 Tobacco use: Secondary | ICD-10-CM | POA: Insufficient documentation

## 2014-06-14 DIAGNOSIS — Z79899 Other long term (current) drug therapy: Secondary | ICD-10-CM | POA: Diagnosis not present

## 2014-06-14 HISTORY — DX: Calculus of kidney: N20.0

## 2014-06-14 LAB — URINALYSIS, ROUTINE W REFLEX MICROSCOPIC
Bilirubin Urine: NEGATIVE
Glucose, UA: NEGATIVE mg/dL
Hgb urine dipstick: NEGATIVE
Ketones, ur: NEGATIVE mg/dL
Leukocytes, UA: NEGATIVE
Nitrite: NEGATIVE
Protein, ur: NEGATIVE mg/dL
Specific Gravity, Urine: 1.03 (ref 1.005–1.030)
Urobilinogen, UA: 0.2 mg/dL (ref 0.0–1.0)
pH: 5.5 (ref 5.0–8.0)

## 2014-06-14 LAB — CBC
HCT: 38.6 % (ref 36.0–46.0)
Hemoglobin: 12.7 g/dL (ref 12.0–15.0)
MCH: 30.2 pg (ref 26.0–34.0)
MCHC: 32.9 g/dL (ref 30.0–36.0)
MCV: 91.7 fL (ref 78.0–100.0)
Platelets: 305 10*3/uL (ref 150–400)
RBC: 4.21 MIL/uL (ref 3.87–5.11)
RDW: 13.1 % (ref 11.5–15.5)
WBC: 6.3 10*3/uL (ref 4.0–10.5)

## 2014-06-14 LAB — BASIC METABOLIC PANEL
Anion gap: 9 (ref 5–15)
BUN: 16 mg/dL (ref 6–23)
CO2: 25 mmol/L (ref 19–32)
Calcium: 9.2 mg/dL (ref 8.4–10.5)
Chloride: 103 mmol/L (ref 96–112)
Creatinine, Ser: 0.73 mg/dL (ref 0.50–1.10)
GFR calc Af Amer: >90 mL/min (ref >90)
GFR calc non Af Amer: >90 mL/min (ref >90)
Glucose, Bld: 93 mg/dL (ref 70–99)
Potassium: 4.2 mmol/L (ref 3.5–5.1)
Sodium: 137 mmol/L (ref 135–145)

## 2014-06-14 LAB — POC URINE PREG, ED: Preg Test, Ur: NEGATIVE

## 2014-06-14 LAB — CBG MONITORING, ED: Glucose-Capillary: 90 mg/dL (ref 70–99)

## 2014-06-14 MED ORDER — SODIUM CHLORIDE 0.9 % IV BOLUS (SEPSIS)
1000.0000 mL | Freq: Once | INTRAVENOUS | Status: AC
  Administered 2014-06-14: 1000 mL via INTRAVENOUS

## 2014-06-14 NOTE — ED Provider Notes (Signed)
CSN: [id number]     Arrival date & time 06/14/14  [id number] History   First MD Initiated Contact with Patient 06/14/14 [phone]     Chief Complaint  Patient presents with  . Near Syncope     (Consider location/radiation/quality/duration/timing/severity/associated sxs/prior Treatment) HPI  Pt presenting with c/o near syncope.  She was walking her dogs and felt lightheaded with tunnel vision, ringing in her ears.  She was having difficulty standing.  This feeling passed, no LOC.  No chest pain, no palpitaitons no headache prior to symptoms onset.  Pt went to work, she works as a Child psychotherapistwaitress- then had another brief episode of similar symptoms while standing at the drink machine- no LOC this time as well.  Pt states she drank coffee and juice this morning but had no breakfast.  No recent fever/chills, no vomiting or diarrhea.  States she drinks a lot of water.  No recent change in meds- states her BP runs low as she is on prazosin- no change in dose for the past 6 months.  Denies taking any supplements, diet pills.  There are no other associated systemic symptoms, there are no other alleviating or modifying factors.   Past Medical History  Diagnosis Date  . History of substance abuse     opiates - has been "clean" x 7 mos., per mother  . History of MRSA infection 2007    face  . Hip deformity, acquired 12/2012    left  . Family history of anesthesia complication     pt's mother has hx. of post-op N/V  . Substance abuse     opiates-past  . Kidney stones    Past Surgical History  Procedure Laterality Date  . Tonsillectomy and adenoidectomy    . Liposuction Left 01/01/2013    Procedure: RECONSTRUCTION OF THE LEFT HIP DEFECT WITH LIPOSUCTION ASSISTANCE;  Surgeon: Louisa SecondGerald Truesdale, MD;  Location: [facility];  Service: Plastics;  Laterality: Left;  Fat injection left hip   Family History  Problem Relation Age of Onset  . Anesthesia problems Mother     post-op N/V  . Thyroid disease  Mother   . Cancer Maternal Grandmother     lung  . Diabetes Paternal Grandfather    History  Substance Use Topics  . Smoking status: Current Every Day Smoker -- 1.00 packs/day for 6 years    Types: Cigarettes  . Smokeless tobacco: Not on file  . Alcohol Use: No   OB History    Gravida Para Term Preterm AB TAB SAB Ectopic Multiple Living   0 0 0 0 0 0 0 0 0 0      Review of Systems  ROS reviewed and all otherwise negative except for mentioned in HPI   Allergies  Macrobid and Ciprofloxacin hcl  Home Medications   Prior to Admission medications   Medication Sig Start Date End Date Taking? Authorizing Provider  BIOTIN PO Take 1 capsule by mouth daily.   Yes Historical Provider, MD  FLUoxetine (PROZAC) 20 MG capsule Take 20 mg by mouth daily.   Yes Historical Provider, MD  gabapentin (NEURONTIN) 100 MG capsule Take 100 mg by mouth at bedtime.   Yes Historical Provider, MD  norelgestromin-ethinyl estradiol (ORTHO EVRA) 150-35 MCG/24HR transdermal patch Place 1 patch onto the skin once a week. 02/06/13  Yes Verner Choleborah S Leonard, CNM  prazosin (MINIPRESS) 2 MG capsule Take 2 mg by mouth at bedtime.   Yes Historical Provider, MD   BP 88/52 mmHg  Pulse  60  Temp(Src) 97.6 F (36.4 C) (Oral)  Resp 16  Ht  (1.575 m)  Wt 115 lb (52.164 kg)  BMI 21.03 kg/m2  SpO2 99%  Vitals reviewed Physical Exam  Physical Examination: General appearance - alert, well appearing, and in no distress Mental status - alert, oriented to person, place, and time Eyes - pupils equal and reactive, extraocular eye movements intact Mouth - mucous membranes moist, pharynx normal without lesions Chest - clear to auscultation, no wheezes, rales or rhonchi, symmetric air entry Heart - normal rate, regular rhythm, normal S1, S2, no murmurs, rubs, clicks or gallops Abdomen - soft, nontender, nondistended, no masses or organomegaly Neurological - alert, oriented x 3, no cranial nerve deficit, strength 5/5 in  extremities x 4, sensation intact Extremities - peripheral pulses normal, no pedal edema, no clubbing or cyanosis Skin - normal coloration and turgor, no rashes  ED Course  Procedures (including critical care time) Labs Review Labs Reviewed  CBC  BASIC METABOLIC PANEL  URINALYSIS, ROUTINE W REFLEX MICROSCOPIC  CBG MONITORING, ED  POC URINE PREG, ED    Imaging Review No results found.   EKG Interpretation   Date/Time:  Friday June 14 2014 10:29:24 EDT Ventricular Rate:  55 PR Interval:  123 QRS Duration: 87 QT Interval:  446 QTC Calculation: 427 R Axis:   75 Text Interpretation:  Sinus rhythm ST elev, probable normal early repol  pattern No old tracing to compare Confirmed by [facility]  MD, Yobana Culliton [phone])  on 06/14/2014 11:12:15 AM      MDM   Final diagnoses:  Near syncope    Pt presenting with c/o near syncope.  ekg reassuring, pt not significantly anemia, no orthostatic hypotension, but HR increased > 20 beats- given IV fluids.  Pt has BP that runs low as she is on prazosin- no changes in dose.  Pt advised to increase hydration.  Pt to arrange for followup with PMD.  Discharged with strict return precautions.  Pt agreeable with plan.    Jerelyn Scott, MD 06/14/14 [phone]

## 2014-06-14 NOTE — ED Notes (Signed)
CBG:90 

## 2014-06-14 NOTE — Discharge Instructions (Signed)
Return to the ED with any concerns including recurrent fainting, difficulty breathing, changes in vision or speech, weakness of arms or legs, decreased level of alertness/lethargy, or any other alarming symptoms

## 2014-06-14 NOTE — ED Notes (Signed)
Pt reports she was walking the dogs this morning when she heard a loud ringing in her ears and her vision went black.  Pt sts "I knew enough to keep holding onto the leashes but I was kind of out of it.  When my vision came back all I could think about was getting back to the house."  Pt reports a similar episode with a shorter duration that occurred while she was at work this morning.  Pt reports cold chills and hot flashes since the episodes.  Pt has a hx of heroin use and has been clean for 7 months.

## 2014-06-24 ENCOUNTER — Other Ambulatory Visit (HOSPITAL_COMMUNITY): Payer: Self-pay | Admitting: Family Medicine

## 2014-06-24 DIAGNOSIS — R55 Syncope and collapse: Secondary | ICD-10-CM

## 2014-06-26 ENCOUNTER — Ambulatory Visit (HOSPITAL_COMMUNITY)
Admission: RE | Admit: 2014-06-26 | Discharge: 2014-06-26 | Disposition: A | Discharge status: Home / Self Care | Payer: 59 | Source: Ambulatory Visit | Attending: Family Medicine | Admitting: Family Medicine

## 2014-06-26 DIAGNOSIS — H9313 Tinnitus, bilateral: Secondary | ICD-10-CM | POA: Diagnosis not present

## 2014-06-26 DIAGNOSIS — R55 Syncope and collapse: Secondary | ICD-10-CM | POA: Insufficient documentation

## 2014-08-14 ENCOUNTER — Telehealth: Payer: Self-pay | Admitting: Certified Nurse Midwife

## 2014-08-14 NOTE — Telephone Encounter (Signed)
See staff message from [place]. Patient's mom called about a missed appointment. There is no DPR on file to share PHI with anyone.

## 2015-06-07 IMAGING — CT CT HEAD W/O CM
2 series · 16 of 30 positions shown, 20 images · non-contrast
Comparison: None.

CLINICAL DATA: Near syncope, ringing in ears

EXAM:
CT HEAD WITHOUT CONTRAST
TECHNIQUE: Contiguous axial images were obtained from the base of the skull
through the vertex without intravenous contrast.

[Series 201: head w/o, idose (1) · axial · non-contrast · 0.49mm/px · z∈[+89,+209]mm · 13 of 30 slices shown, 17 images]
[im 3/30  brain]
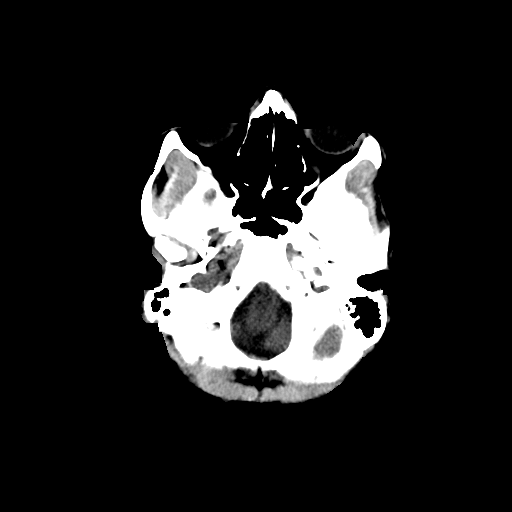
[im 3/30  bone]
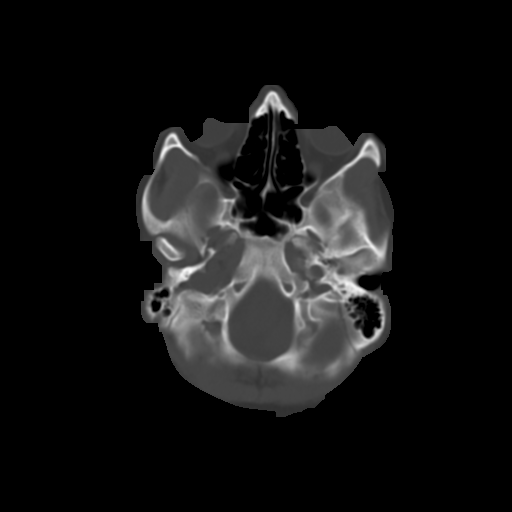
[im 5/30  brain]
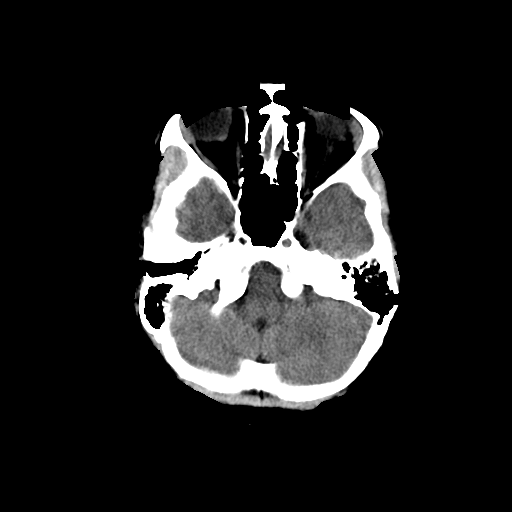
[im 7/30  brain]
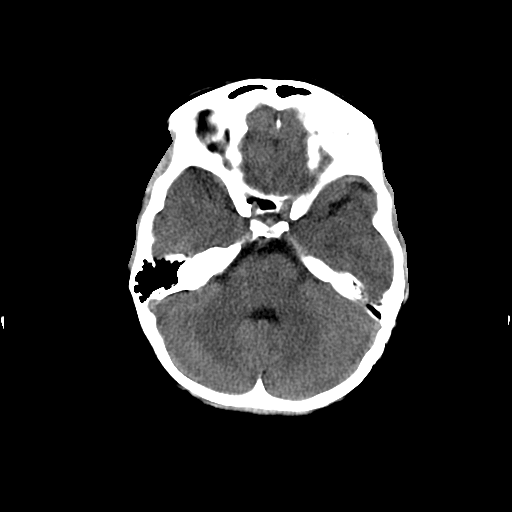
[im 9/30  brain]
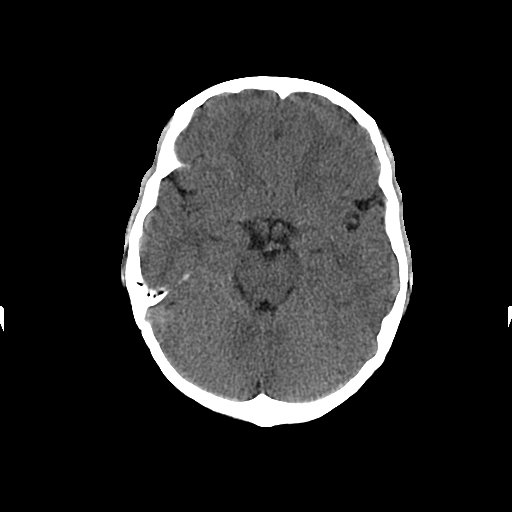
[im 11/30  brain]
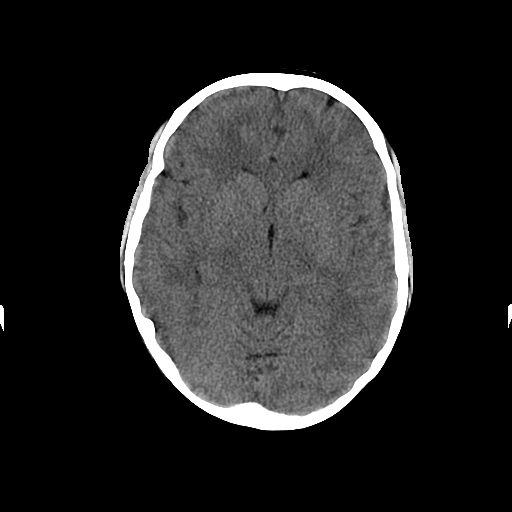
[im 11/30  bone]
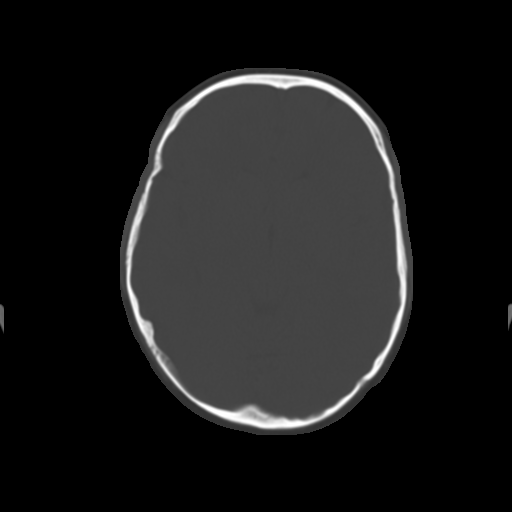
[im 13/30  brain]
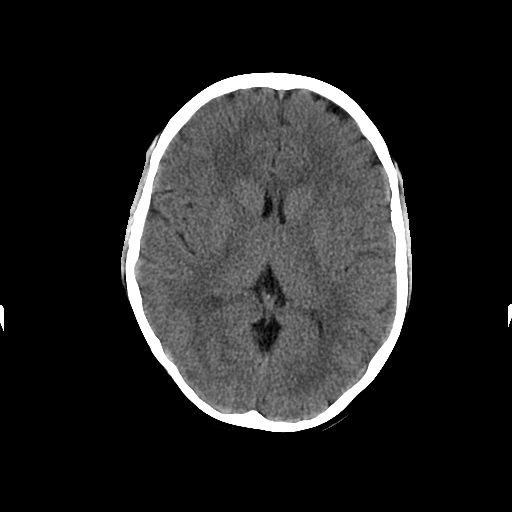
[im 15/30  brain]
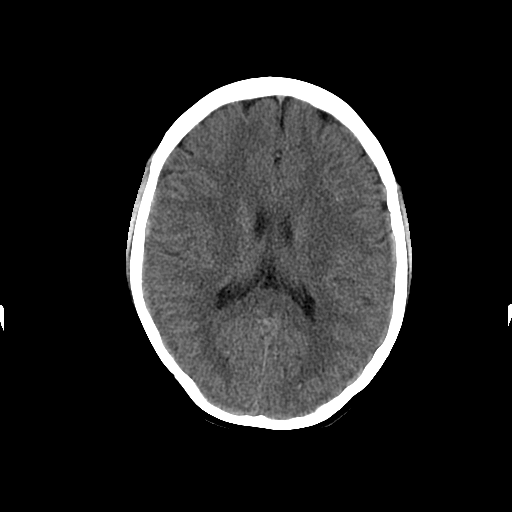
[im 17/30  brain]
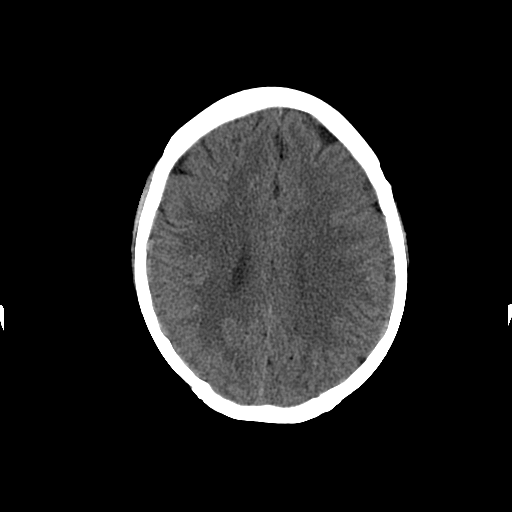
[im 19/30  brain]
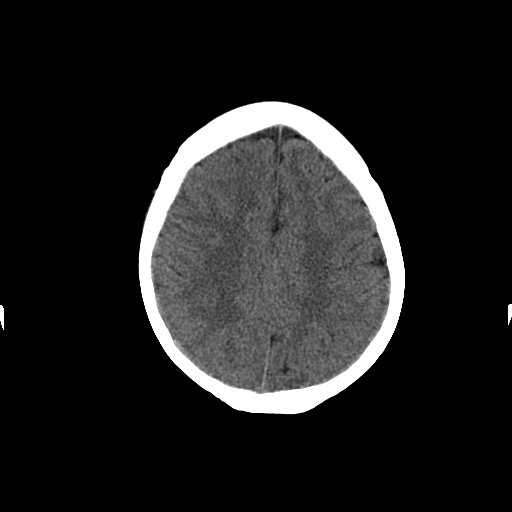
[im 19/30  bone]
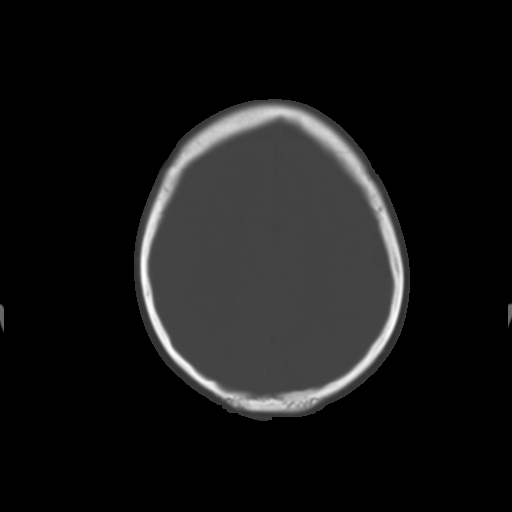
[im 21/30  brain]
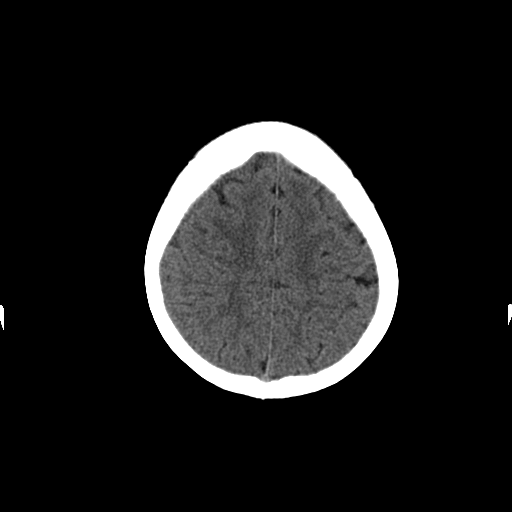
[im 23/30  brain]
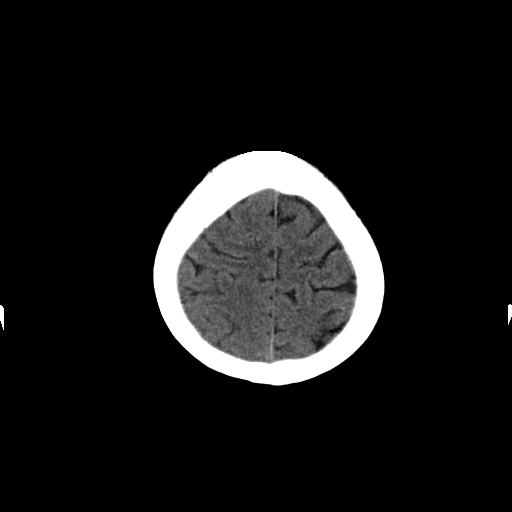
[im 25/30  brain]
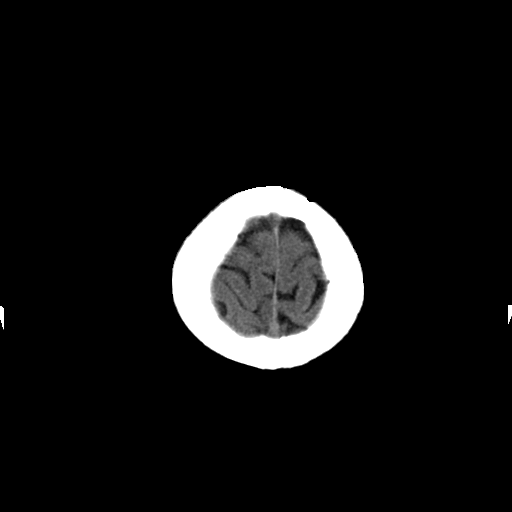
[im 27/30  brain]
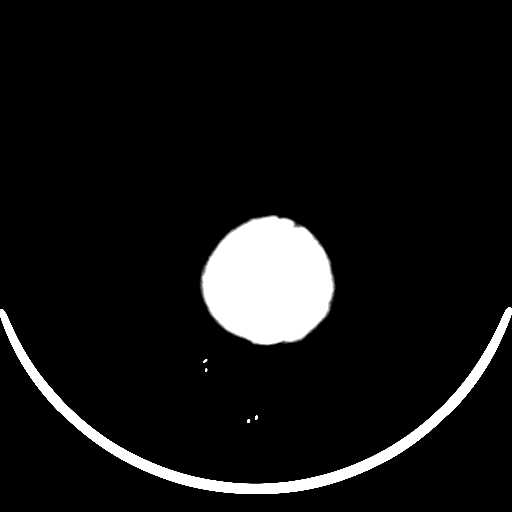
[im 27/30  bone]
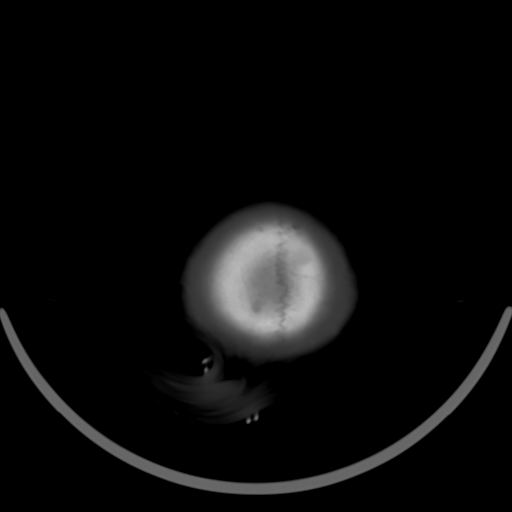

[Series 202: head w/o bone, idose (1) · axial · non-contrast · 0.49mm/px · z∈[+89,+129]mm · 3 of 30 slices shown]
[im 3/30  bone]
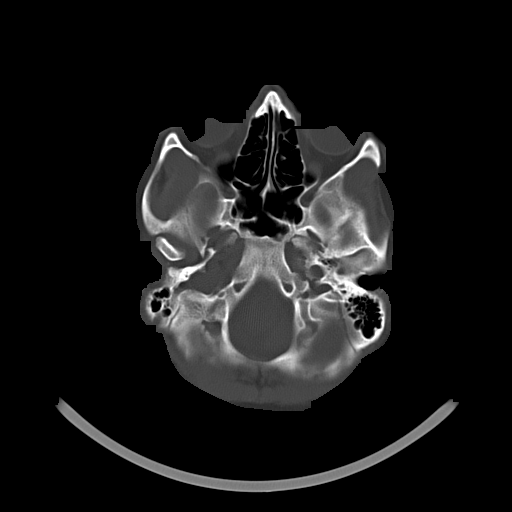
[im 7/30  bone]
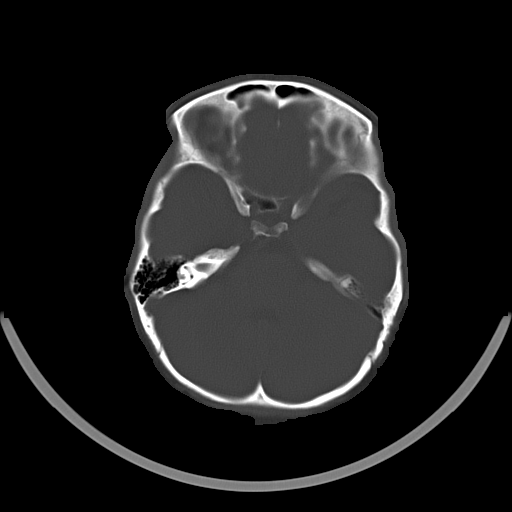
[im 11/30  bone]
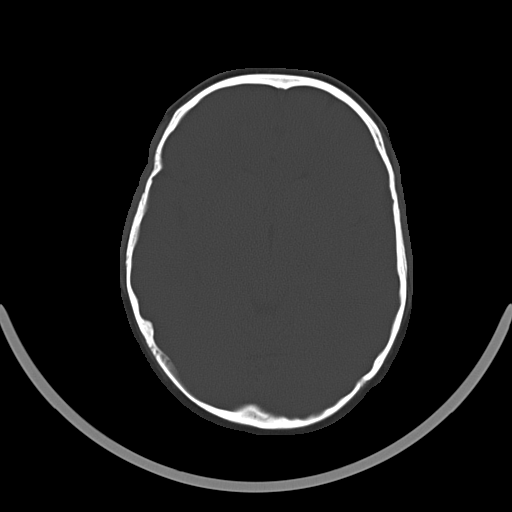

[16 of 30 positions shown; findings below may reference images not displayed]

FINDINGS: The ventricular system is normal in size and configuration for age
and the septum is in a normal midline position. The fourth ventricle
and basilar cisterns are unremarkable. No hemorrhage, mass lesion,
or acute infarction is seen. No acute calvarial abnormality is seen.
IMPRESSION: Negative unenhanced CT of the brain.

## 2015-09-15 DIAGNOSIS — L255 Unspecified contact dermatitis due to plants, except food: Secondary | ICD-10-CM | POA: Diagnosis not present

## 2016-03-17 DIAGNOSIS — Z01419 Encounter for gynecological examination (general) (routine) without abnormal findings: Secondary | ICD-10-CM | POA: Diagnosis not present

## 2016-03-17 DIAGNOSIS — Z6820 Body mass index (BMI) 20.0-20.9, adult: Secondary | ICD-10-CM | POA: Diagnosis not present

## 2016-03-17 DIAGNOSIS — Z3202 Encounter for pregnancy test, result negative: Secondary | ICD-10-CM | POA: Diagnosis not present

## 2016-03-22 DIAGNOSIS — A084 Viral intestinal infection, unspecified: Secondary | ICD-10-CM | POA: Diagnosis not present

## 2016-03-22 DIAGNOSIS — B349 Viral infection, unspecified: Secondary | ICD-10-CM | POA: Diagnosis not present

## 2016-03-22 DIAGNOSIS — R509 Fever, unspecified: Secondary | ICD-10-CM | POA: Diagnosis not present

## 2016-06-25 DIAGNOSIS — H52223 Regular astigmatism, bilateral: Secondary | ICD-10-CM | POA: Diagnosis not present

## 2016-09-05 DIAGNOSIS — R05 Cough: Secondary | ICD-10-CM | POA: Diagnosis not present

## 2016-09-13 DIAGNOSIS — J208 Acute bronchitis due to other specified organisms: Secondary | ICD-10-CM | POA: Diagnosis not present

## 2017-02-22 NOTE — L&D Delivery Note (Signed)
Patient was C/C/+2 and pushed for <3215minutes with epidural.    NSVD  female infant, Apgars 8/9, weight pending.   The patient had right labial laceration repaired with 3-0 vicryl. Fundus was firm. EBL was expected amount. Placenta was delivered intact. Vagina was clear.  Delayed cord clamping done for 30-60 seconds while warming baby. Baby was vigorous and doing skin to skin with mother.  Dana Gonzales, [name]

## 2017-02-28 DIAGNOSIS — Z3201 Encounter for pregnancy test, result positive: Secondary | ICD-10-CM | POA: Diagnosis not present

## 2017-02-28 DIAGNOSIS — Z3480 Encounter for supervision of other normal pregnancy, unspecified trimester: Secondary | ICD-10-CM | POA: Diagnosis not present

## 2017-02-28 DIAGNOSIS — Z23 Encounter for immunization: Secondary | ICD-10-CM | POA: Diagnosis not present

## 2017-02-28 DIAGNOSIS — Z01419 Encounter for gynecological examination (general) (routine) without abnormal findings: Secondary | ICD-10-CM | POA: Diagnosis not present

## 2017-02-28 DIAGNOSIS — Z124 Encounter for screening for malignant neoplasm of cervix: Secondary | ICD-10-CM | POA: Diagnosis not present

## 2017-02-28 DIAGNOSIS — Z6821 Body mass index (BMI) 21.0-21.9, adult: Secondary | ICD-10-CM | POA: Diagnosis not present

## 2017-02-28 DIAGNOSIS — N925 Other specified irregular menstruation: Secondary | ICD-10-CM | POA: Diagnosis not present

## 2017-02-28 LAB — OB RESULTS CONSOLE GC/CHLAMYDIA
Chlamydia: NEGATIVE
Gonorrhea: NEGATIVE

## 2017-03-16 DIAGNOSIS — Z3480 Encounter for supervision of other normal pregnancy, unspecified trimester: Secondary | ICD-10-CM | POA: Diagnosis not present

## 2017-03-22 DIAGNOSIS — Z3480 Encounter for supervision of other normal pregnancy, unspecified trimester: Secondary | ICD-10-CM | POA: Diagnosis not present

## 2017-03-22 DIAGNOSIS — F1110 Opioid abuse, uncomplicated: Secondary | ICD-10-CM | POA: Diagnosis not present

## 2017-03-22 LAB — OB RESULTS CONSOLE RUBELLA ANTIBODY, IGM: Rubella: IMMUNE

## 2017-03-22 LAB — OB RESULTS CONSOLE HEPATITIS B SURFACE ANTIGEN: Hepatitis B Surface Ag: NEGATIVE

## 2017-03-22 LAB — OB RESULTS CONSOLE HIV ANTIBODY (ROUTINE TESTING): HIV: NONREACTIVE

## 2017-03-23 DIAGNOSIS — Z8619 Personal history of other infectious and parasitic diseases: Secondary | ICD-10-CM | POA: Diagnosis not present

## 2017-05-16 DIAGNOSIS — Z363 Encounter for antenatal screening for malformations: Secondary | ICD-10-CM | POA: Diagnosis not present

## 2017-06-21 ENCOUNTER — Inpatient Hospital Stay (HOSPITAL_COMMUNITY): Payer: BLUE CROSS/BLUE SHIELD

## 2017-06-21 ENCOUNTER — Inpatient Hospital Stay (HOSPITAL_COMMUNITY)
Admission: AD | Admit: 2017-06-21 | Discharge: 2017-06-22 | Disposition: A | Discharge status: Home / Self Care | Payer: BLUE CROSS/BLUE SHIELD | Source: Ambulatory Visit | Attending: Obstetrics and Gynecology | Admitting: Obstetrics and Gynecology

## 2017-06-21 ENCOUNTER — Encounter (HOSPITAL_COMMUNITY): Payer: Self-pay

## 2017-06-21 DIAGNOSIS — F17210 Nicotine dependence, cigarettes, uncomplicated: Secondary | ICD-10-CM | POA: Diagnosis not present

## 2017-06-21 DIAGNOSIS — Z8614 Personal history of Methicillin resistant Staphylococcus aureus infection: Secondary | ICD-10-CM | POA: Diagnosis not present

## 2017-06-21 DIAGNOSIS — O9989 Other specified diseases and conditions complicating pregnancy, childbirth and the puerperium: Secondary | ICD-10-CM | POA: Diagnosis not present

## 2017-06-21 DIAGNOSIS — R109 Unspecified abdominal pain: Secondary | ICD-10-CM | POA: Diagnosis present

## 2017-06-21 DIAGNOSIS — Z3A24 24 weeks gestation of pregnancy: Secondary | ICD-10-CM | POA: Diagnosis not present

## 2017-06-21 DIAGNOSIS — Z87442 Personal history of urinary calculi: Secondary | ICD-10-CM | POA: Diagnosis not present

## 2017-06-21 DIAGNOSIS — O99332 Smoking (tobacco) complicating pregnancy, second trimester: Secondary | ICD-10-CM | POA: Diagnosis not present

## 2017-06-21 DIAGNOSIS — R39198 Other difficulties with micturition: Secondary | ICD-10-CM

## 2017-06-21 DIAGNOSIS — N132 Hydronephrosis with renal and ureteral calculous obstruction: Secondary | ICD-10-CM | POA: Diagnosis not present

## 2017-06-21 DIAGNOSIS — O26892 Other specified pregnancy related conditions, second trimester: Secondary | ICD-10-CM | POA: Diagnosis not present

## 2017-06-21 DIAGNOSIS — R300 Dysuria: Secondary | ICD-10-CM | POA: Insufficient documentation

## 2017-06-21 LAB — CBC
HEMATOCRIT: 33.3 % — AB (ref 36.0–46.0)
Hemoglobin: 11.3 g/dL — ABNORMAL LOW (ref 12.0–15.0)
MCH: 31.7 pg (ref 26.0–34.0)
MCHC: 33.9 g/dL (ref 30.0–36.0)
MCV: 93.5 fL (ref 78.0–100.0)
Platelets: 288 10*3/uL (ref 150–400)
RBC: 3.56 MIL/uL — AB (ref 3.87–5.11)
RDW: 13.3 % (ref 11.5–15.5)
WBC: 18.4 10*3/uL — ABNORMAL HIGH (ref 4.0–10.5)

## 2017-06-21 LAB — URINALYSIS, ROUTINE W REFLEX MICROSCOPIC
Bacteria, UA: NONE SEEN
Bilirubin Urine: NEGATIVE
Glucose, UA: NEGATIVE mg/dL
HGB URINE DIPSTICK: NEGATIVE
Ketones, ur: 80 mg/dL — AB
Leukocytes, UA: NEGATIVE
Nitrite: NEGATIVE
Protein, ur: NEGATIVE mg/dL
SPECIFIC GRAVITY, URINE: 1.025 (ref 1.005–1.030)
pH: 6 (ref 5.0–8.0)

## 2017-06-21 MED ORDER — KETOROLAC TROMETHAMINE 30 MG/ML IJ SOLN
30.0000 mg | Freq: Once | INTRAMUSCULAR | Status: AC
  Administered 2017-06-21: 30 mg via INTRAVENOUS
  Filled 2017-06-21: qty 1

## 2017-06-21 MED ORDER — LACTATED RINGERS IV BOLUS
1000.0000 mL | Freq: Once | INTRAVENOUS | Status: AC
  Administered 2017-06-21: 1000 mL via INTRAVENOUS

## 2017-06-21 MED ORDER — TAMSULOSIN HCL 0.4 MG PO CAPS
0.4000 mg | ORAL_CAPSULE | Freq: Once | ORAL | Status: AC
  Administered 2017-06-21: 0.4 mg via ORAL
  Filled 2017-06-21: qty 1

## 2017-06-21 NOTE — MAU Note (Addendum)
Pt reports right sided flank pain that started today around 11:30am. Went to the doctor today and was given Keflex and a medication to help her go to that bathroom. States she has a hard time urinating and only urinates small amounts. Has some burning with urination. Denies any frank blood in urine. Pt states she took 2 Tylenol around noon today-did not help. States she has a history of kidney stones and it feels like that

## 2017-06-21 NOTE — MAU Provider Note (Signed)
History     CSN: [id number]  Arrival date and time: 06/21/17 2119   First Provider Initiated Contact with Patient 06/21/17 2233      Chief Complaint  Patient presents with  . Dysuria   HPI  Ms.  Dana Gonzales is a 27 y.o. year old G25P0000 female at [redacted]w[redacted]d weeks gestation who presents to MAU reporting right sided flank pain that started today around 1130. She was seen at her OB's office today and was Rx'd Keflex and "a medication to help her go to the BR". She reports "having a hard time urinating" and only "dribbles" small amounts when she urinates. She has some burning with urination. Denies any hematuria. She took 2 Tylenol around noon today; without relief. She has a h/o kidney stones and states "it feels like it did when I had kidney stones".  Past Medical History:  Diagnosis Date  . Family history of anesthesia complication    pt's mother has hx. of post-op N/V  . Hip deformity, acquired 12/2012   left  . History of MRSA infection 2007   face  . History of substance abuse    opiates - has been "clean" x 7 mos., per mother  . Kidney stones   . Substance abuse (H[facility])    opiates-past    Past Surgical History:  Procedure Laterality Date  . LIPOSUCTION Left 01/01/2013   Procedure: RECONSTRUCTION OF THE LEFT HIP DEFECT WITH LIPOSUCTION ASSISTANCE;  Surgeon: Louisa Second, MD;  Location: [facility];  Service: Plastics;  Laterality: Left;  Fat injection left hip  . TONSILLECTOMY AND ADENOIDECTOMY      Family History  Problem Relation Age of Onset  . Anesthesia problems Mother        post-op N/V  . Thyroid disease Mother   . Cancer Maternal Grandmother        lung  . Diabetes Paternal Grandfather     Social History   Tobacco Use  . Smoking status: Current Every Day Smoker    Packs/day: 1.00    Years: 6.00    Pack years: 6.00    Types: Cigarettes  . Smokeless tobacco: Never Used  Substance Use Topics  . Alcohol use: No  . Drug use: No     Comment: HX. of substance abuse    Allergies:  Allergies  Allergen Reactions  . Macrobid [Nitrofurantoin Macrocrystal] Nausea And Vomiting  . Ciprofloxacin Hcl Nausea And Vomiting    Medications Prior to Admission  Medication Sig Dispense Refill Last Dose  . BIOTIN PO Take 1 capsule by mouth daily.   More than a month at Unknown time  . FLUoxetine (PROZAC) 20 MG capsule Take 20 mg by mouth daily.   More than a month at Unknown time  . gabapentin (NEURONTIN) 100 MG capsule Take 100 mg by mouth at bedtime.   More than a month at Unknown time  . norelgestromin-ethinyl estradiol (ORTHO EVRA) 150-35 MCG/24HR transdermal patch Place 1 patch onto the skin once a week. 3 patch 12 More than a month at Unknown time  . prazosin (MINIPRESS) 2 MG capsule Take 2 mg by mouth at bedtime.   More than a month at Unknown time    Review of Systems  Constitutional: Negative.   HENT: Negative.   Eyes: Negative.   Respiratory: Negative.   Cardiovascular: Negative.   Gastrointestinal: Negative.   Endocrine: Negative.   Genitourinary: Positive for decreased urine volume, dysuria ("burning") and flank pain.  Skin: Negative.   Allergic/Immunologic:  Negative.   Neurological: Negative.   Hematological: Negative.   Psychiatric/Behavioral: Negative.    Physical Exam   Blood pressure 98/68, pulse (!) 56, temperature 98.8 F (37.1 C), temperature source Oral, resp. rate 18, height  (1.6 m), weight 135 lb (61.2 kg), SpO2 96 %.  Physical Exam  Nursing note and vitals reviewed. Constitutional: She is oriented to person, place, and time. She appears well-developed and well-nourished.  HENT:  Head: Normocephalic and atraumatic.  Eyes: Pupils are equal, round, and reactive to light.  Neck: Normal range of motion.  Cardiovascular: Normal rate, regular rhythm and normal heart sounds.  Respiratory: Effort normal.  GI: Soft. Bowel sounds are normal. There is CVA tenderness (RT side).  Genitourinary:   Genitourinary Comments: Dilation: Closed Effacement (%): Thick Exam by: Arita Miss, CNM    Musculoskeletal: Normal range of motion.  Neurological: She is alert and oriented to person, place, and time.  Skin: Skin is warm and dry.  Psychiatric: She has a normal mood and affect. Her behavior is normal. Judgment and thought content normal.    MAU Course  Procedures  MDM CCUA Renal U/S LR bolus 1000 ml @ 999 ml/hr Toradol 30 mg IV-- improved pain Strain all urines NST - FHR: 150 bpm / moderate variability / accels present / decels absent / TOCO: none  *Consult with Dr. Tenny Craw @ 2245 - notified of patient's complaints, assessments recommended tx plan renal U/S, Flomax, Toradol 30 mg IV // 0001 TC to update Dr. Tenny Craw on U/S results as well as pain relief from Toradol - ok to d/c home  Results for orders placed or performed during the hospital encounter of 06/21/17 (from the past 24 hour(s))  Urinalysis, Routine w reflex microscopic     Status: Abnormal   Collection Time: 06/21/17  9:23 PM  Result Value Ref Range   Color, Urine YELLOW YELLOW   APPearance CLEAR CLEAR   Specific Gravity, Urine 1.025 1.005 - 1.030   pH 6.0 5.0 - 8.0   Glucose, UA NEGATIVE NEGATIVE mg/dL   Hgb urine dipstick NEGATIVE NEGATIVE   Bilirubin Urine NEGATIVE NEGATIVE   Ketones, ur 80 (A) NEGATIVE mg/dL   Protein, ur NEGATIVE NEGATIVE mg/dL   Nitrite NEGATIVE NEGATIVE   Leukocytes, UA NEGATIVE NEGATIVE   RBC / HPF 0-5 0 - 5 RBC/hpf   WBC, UA 0-5 0 - 5 WBC/hpf   Bacteria, UA NONE SEEN NONE SEEN   Mucus PRESENT   CBC     Status: Abnormal   Collection Time: 06/21/17 10:53 PM  Result Value Ref Range   WBC 18.4 (H) 4.0 - 10.5 K/uL   RBC 3.56 (L) 3.87 - 5.11 MIL/uL   Hemoglobin 11.3 (L) 12.0 - 15.0 g/dL   HCT 78.2 (L) 95.6 - 21.3 %   MCV 93.5 78.0 - 100.0 fL   MCH 31.7 26.0 - 34.0 pg   MCHC 33.9 30.0 - 36.0 g/dL   RDW 08.6 57.8 - 46.9 %   Platelets 288 150 - 400 K/uL    Assessment and Plan  Difficulty in  urination  - Continue Keflex as prescribed - Rx for Flomax 0.4 mg daily with dinner - Stay well-hydrated - Strain all urine  Dysuria during pregnancy in second trimester - Continue Uribel as prescribed - Information provided on dysuria   Flank pain  - Rx for Toradol 10 mg every 6 hrs prn pain (24 hrs worth)  - Discharge patient - Patient verbalized an understanding of the plan of care  and agrees.    Raelyn Mora, MSN, CNM 06/22/2017, 10:33 PM

## 2017-06-22 ENCOUNTER — Encounter (HOSPITAL_COMMUNITY): Payer: Self-pay | Admitting: Obstetrics and Gynecology

## 2017-06-22 DIAGNOSIS — R300 Dysuria: Secondary | ICD-10-CM

## 2017-06-22 DIAGNOSIS — O26892 Other specified pregnancy related conditions, second trimester: Secondary | ICD-10-CM | POA: Diagnosis present

## 2017-06-22 DIAGNOSIS — R109 Unspecified abdominal pain: Secondary | ICD-10-CM | POA: Diagnosis present

## 2017-06-22 DIAGNOSIS — O9989 Other specified diseases and conditions complicating pregnancy, childbirth and the puerperium: Secondary | ICD-10-CM | POA: Diagnosis not present

## 2017-06-22 MED ORDER — KETOROLAC TROMETHAMINE 10 MG PO TABS: 10.0000 mg | ORAL_TABLET | Freq: Four times a day (QID) | ORAL | 0 refills | Status: AC | PRN

## 2017-06-22 MED ORDER — TAMSULOSIN HCL 0.4 MG PO CAPS: 0.4000 mg | ORAL_CAPSULE | Freq: Every day | ORAL | 1 refills | Status: DC

## 2017-06-22 NOTE — Discharge Instructions (Signed)
You are to strain all of your urine in case you pass a stone. There was, however, no stone detected on your ultrasound tonight. You have been given 1 day worth of Toradol to safely take for the pain. Please call your OB office, if you have any further problems and/or concerns.

## 2017-07-12 DIAGNOSIS — Z23 Encounter for immunization: Secondary | ICD-10-CM | POA: Diagnosis not present

## 2017-07-12 DIAGNOSIS — Z3480 Encounter for supervision of other normal pregnancy, unspecified trimester: Secondary | ICD-10-CM | POA: Diagnosis not present

## 2017-07-14 ENCOUNTER — Encounter (HOSPITAL_COMMUNITY): Payer: Self-pay | Admitting: *Deleted

## 2017-07-14 ENCOUNTER — Inpatient Hospital Stay (HOSPITAL_COMMUNITY)
Admission: AD | Admit: 2017-07-14 | Discharge: 2017-07-14 | Disposition: A | Discharge status: Home / Self Care | Payer: BLUE CROSS/BLUE SHIELD | Source: Ambulatory Visit | Attending: Obstetrics and Gynecology | Admitting: Obstetrics and Gynecology

## 2017-07-14 ENCOUNTER — Inpatient Hospital Stay (HOSPITAL_COMMUNITY): Payer: BLUE CROSS/BLUE SHIELD

## 2017-07-14 DIAGNOSIS — N858 Other specified noninflammatory disorders of uterus: Secondary | ICD-10-CM

## 2017-07-14 DIAGNOSIS — N859 Noninflammatory disorder of uterus, unspecified: Secondary | ICD-10-CM

## 2017-07-14 DIAGNOSIS — O4692 Antepartum hemorrhage, unspecified, second trimester: Secondary | ICD-10-CM

## 2017-07-14 DIAGNOSIS — Z87891 Personal history of nicotine dependence: Secondary | ICD-10-CM | POA: Insufficient documentation

## 2017-07-14 DIAGNOSIS — R319 Hematuria, unspecified: Secondary | ICD-10-CM

## 2017-07-14 DIAGNOSIS — Z881 Allergy status to other antibiotic agents status: Secondary | ICD-10-CM | POA: Diagnosis not present

## 2017-07-14 DIAGNOSIS — Z8614 Personal history of Methicillin resistant Staphylococcus aureus infection: Secondary | ICD-10-CM | POA: Insufficient documentation

## 2017-07-14 DIAGNOSIS — Z3A27 27 weeks gestation of pregnancy: Secondary | ICD-10-CM | POA: Diagnosis not present

## 2017-07-14 DIAGNOSIS — O26892 Other specified pregnancy related conditions, second trimester: Secondary | ICD-10-CM | POA: Insufficient documentation

## 2017-07-14 LAB — CBC
HCT: 32.8 % — ABNORMAL LOW (ref 36.0–46.0)
Hemoglobin: 11 g/dL — ABNORMAL LOW (ref 12.0–15.0)
MCH: 32.1 pg (ref 26.0–34.0)
MCHC: 33.5 g/dL (ref 30.0–36.0)
MCV: 95.6 fL (ref 78.0–100.0)
PLATELETS: 261 10*3/uL (ref 150–400)
RBC: 3.43 MIL/uL — ABNORMAL LOW (ref 3.87–5.11)
RDW: 13.1 % (ref 11.5–15.5)
WBC: 11.9 10*3/uL — ABNORMAL HIGH (ref 4.0–10.5)

## 2017-07-14 LAB — WET PREP, GENITAL
Clue Cells Wet Prep HPF POC: NONE SEEN
SPERM: NONE SEEN
Trich, Wet Prep: NONE SEEN
Yeast Wet Prep HPF POC: NONE SEEN

## 2017-07-14 LAB — URINALYSIS, ROUTINE W REFLEX MICROSCOPIC
Bilirubin Urine: NEGATIVE
Glucose, UA: NEGATIVE mg/dL
Ketones, ur: NEGATIVE mg/dL
Leukocytes, UA: NEGATIVE
NITRITE: NEGATIVE
PH: 6 (ref 5.0–8.0)
Protein, ur: NEGATIVE mg/dL
Specific Gravity, Urine: 1.013 (ref 1.005–1.030)

## 2017-07-14 LAB — ABO/RH: ABO/RH(D): O POS

## 2017-07-14 NOTE — MAU Note (Signed)
Pt stated she stared having some spotting yesterday. Passed a few blood clots today. Denies and pain or cramping. Good fetal movement felt.

## 2017-07-14 NOTE — MAU Provider Note (Signed)
History     CSN: [id number]  Arrival date and time: 07/14/17 1233   First Provider Initiated Contact with Patient 07/14/17 1303      Chief Complaint  Patient presents with  . Vaginal Bleeding   HPI Dana Gonzales is a 27 y.o. G2P0010 at [redacted]w[redacted]d who presents with vaginal bleeding. She states she noticed spotting yesterday and passed several small clots this morning around 11am. She denies any vaginal discharge or leaking of fluid. Denies pain. Reports good fetal movement. She denies abnormal ultrasounds this pregnancy.   OB History    Gravida  2   Para  0   Term  0   Preterm  0   AB  1   Living  0     SAB  1   TAB  0   Ectopic  0   Multiple  0   Live Births              Past Medical History:  Diagnosis Date  . Family history of anesthesia complication    pt's mother has hx. of post-op N/V  . Hip deformity, acquired 12/2012   left  . History of MRSA infection 2007   face  . History of substance abuse    opiates - has been "clean" x 7 mos., per mother  . Kidney stones   . Substance abuse (HCC)    opiates-past    Past Surgical History:  Procedure Laterality Date  . LIPOSUCTION Left 01/01/2013   Procedure: RECONSTRUCTION OF THE LEFT HIP DEFECT WITH LIPOSUCTION ASSISTANCE;  Surgeon: Louisa Second, MD;  Location: [facility];  Service: Plastics;  Laterality: Left;  Fat injection left hip  . TONSILLECTOMY AND ADENOIDECTOMY      Family History  Problem Relation Age of Onset  . Anesthesia problems Mother        post-op N/V  . Thyroid disease Mother   . Cancer Maternal Grandmother        lung  . Diabetes Paternal Grandfather     Social History   Tobacco Use  . Smoking status: Former Smoker    Packs/day: 1.00    Years: 6.00    Pack years: 6.00    Types: Cigarettes    Last attempt to quit: 02/13/2017    Years since quitting: 0.4  . Smokeless tobacco: Never Used  Substance Use Topics  . Alcohol use: No  . Drug use: No     Comment: HX. of substance abuse    Allergies:  Allergies  Allergen Reactions  . Macrobid [Nitrofurantoin Macrocrystal] Nausea And Vomiting  . Ciprofloxacin Hcl Nausea And Vomiting    Medications Prior to Admission  Medication Sig Dispense Refill Last Dose  . BIOTIN PO Take 1 capsule by mouth daily.   More than a month at Unknown time  . FLUoxetine (PROZAC) 20 MG capsule Take 20 mg by mouth daily.   More than a month at Unknown time  . gabapentin (NEURONTIN) 100 MG capsule Take 100 mg by mouth at bedtime.   More than a month at Unknown time  . norelgestromin-ethinyl estradiol (ORTHO EVRA) 150-35 MCG/24HR transdermal patch Place 1 patch onto the skin once a week. 3 patch 12 More than a month at Unknown time  . prazosin (MINIPRESS) 2 MG capsule Take 2 mg by mouth at bedtime.   More than a month at Unknown time  . tamsulosin (FLOMAX) 0.4 MG CAPS capsule Take 1 capsule (0.4 mg total) by mouth daily after supper.  30 capsule 1     Review of Systems  Constitutional: Negative.  Negative for fatigue and fever.  HENT: Negative.   Respiratory: Negative.  Negative for shortness of breath.   Cardiovascular: Negative.  Negative for chest pain.  Gastrointestinal: Negative.  Negative for abdominal pain, constipation, diarrhea, nausea and vomiting.  Genitourinary: Positive for vaginal bleeding. Negative for dysuria and vaginal discharge.  Neurological: Negative.  Negative for dizziness and headaches.   Physical Exam   Blood pressure 108/67, pulse 90, temperature 98.4 F (36.9 C), resp. rate 18, height  (1.6 m), weight 139 lb (63 kg).  Physical Exam  Nursing note and vitals reviewed. Constitutional: She is oriented to person, place, and time. She appears well-developed and well-nourished. No distress.  HENT:  Head: Normocephalic.  Eyes: Pupils are equal, round, and reactive to light.  Cardiovascular: Normal rate, regular rhythm and normal heart sounds.  Respiratory: Effort normal and  breath sounds normal. No respiratory distress.  GI: Soft. Bowel sounds are normal. She exhibits no distension. There is no tenderness.  Genitourinary:  Genitourinary Comments: Pelvic exam: Cervix pink, visually closed, without lesion, scant white creamy discharge, vaginal walls and external genitalia normal  Neurological: She is alert and oriented to person, place, and time.  Skin: Skin is warm and dry.  Psychiatric: She has a normal mood and affect. Her behavior is normal. Judgment and thought content normal.    Fetal Tracing:  Baseline: 120 Variability: moderate Accels: 15x15 Decels: none  Toco: 2 uc's with irritability  Dilation: Closed Effacement (%): Thick Cervical Position: Posterior Exam by:: Ma Hillock CNM   MAU Course  Procedures Results for orders placed or performed during the hospital encounter of 07/14/17 (from the past 24 hour(s))  Urinalysis, Routine w reflex microscopic     Status: Abnormal   Collection Time: 07/14/17 12:43 PM  Result Value Ref Range   Color, Urine YELLOW YELLOW   APPearance CLEAR CLEAR   Specific Gravity, Urine 1.013 1.005 - 1.030   pH 6.0 5.0 - 8.0   Glucose, UA NEGATIVE NEGATIVE mg/dL   Hgb urine dipstick MODERATE (A) NEGATIVE   Bilirubin Urine NEGATIVE NEGATIVE   Ketones, ur NEGATIVE NEGATIVE mg/dL   Protein, ur NEGATIVE NEGATIVE mg/dL   Nitrite NEGATIVE NEGATIVE   Leukocytes, UA NEGATIVE NEGATIVE   RBC / HPF 21-50 0 - 5 RBC/hpf   WBC, UA 0-5 0 - 5 WBC/hpf   Bacteria, UA RARE (A) NONE SEEN   Mucus PRESENT   Wet prep, genital     Status: Abnormal   Collection Time: 07/14/17  1:13 PM  Result Value Ref Range   Yeast Wet Prep HPF POC NONE SEEN NONE SEEN   Trich, Wet Prep NONE SEEN NONE SEEN   Clue Cells Wet Prep HPF POC NONE SEEN NONE SEEN   WBC, Wet Prep HPF POC MODERATE (A) NONE SEEN   Sperm NONE SEEN   CBC     Status: Abnormal   Collection Time: 07/14/17  1:20 PM  Result Value Ref Range   WBC 11.9 (H) 4.0 - 10.5 K/uL   RBC  3.43 (L) 3.87 - 5.11 MIL/uL   Hemoglobin 11.0 (L) 12.0 - 15.0 g/dL   HCT 16.1 (L) 09.6 - 04.5 %   MCV 95.6 78.0 - 100.0 fL   MCH 32.1 26.0 - 34.0 pg   MCHC 33.5 30.0 - 36.0 g/dL   RDW 40.9 81.1 - 91.4 %   Platelets 261 150 - 400 K/uL   [place]  Mfm Ob Limited  Result Date: 07/14/2017 ----------------------------------------------------------------------  OBSTETRICS REPORT                      (Signed Final 07/14/2017 02:04 pm) ---------------------------------------------------------------------- Patient Info  ID #:       [id number]                          D.O.B.:  05-22-1990 (26 yrs)  Name:       Dana Gonzales                 Visit Date: 07/14/2017 01:20 pm ---------------------------------------------------------------------- Performed By  Performed By:     Tomma Lightning             Ref. Address:     Faculty                    RDMS,RVT  Attending:        Erle Crocker MD     Location:         [facility]  Referred By:      Rolm Bookbinder CNM ---------------------------------------------------------------------- Orders   #  Description                                 Code   1  [place] MFM OB LIMITED                           [id number]  ----------------------------------------------------------------------   #  Ordered By               Order #        Accession #    Episode #   1  Cleone Slim           [id number]      [id number]     [id number]  ---------------------------------------------------------------------- Indications   [redacted] weeks gestation of pregnancy                Z3A.27   Vaginal bleeding in pregnancy, second          O46.92   trimester  ---------------------------------------------------------------------- OB History  Blood Type:            Height:  5'3"   Weight (lb):  139       BMI:  24.62  Gravidity:    2          SAB:   1 ---------------------------------------------------------------------- Fetal Evaluation  Num Of Fetuses:     1  Fetal Heart         137  Rate(bpm):  Cardiac  Activity:   Observed  Presentation:       Breech  Placenta:           No abruption or previa seen, posterior  Amniotic Fluid  AFI FV:      Subjectively within normal limits                              Largest Pocket(cm)                              5.17 ----------------------------------------------------------------------  Gestational Age  Clinical EDD:  27w 5d                                        EDD:   10/08/17  Best:          27w 5d     Det. By:  Clinical EDD             EDD:   10/08/17 ---------------------------------------------------------------------- Anatomy  Stomach:               Appears normal, left   Bladder:                Appears normal                         sided ---------------------------------------------------------------------- Cervix Uterus Adnexa  Cervix  Length:            3.9  cm.  Normal appearance by transabdominal scan. ---------------------------------------------------------------------- Impression  Indication: 27 yr old G2P0010 at [redacted]w[redacted]d with vaginal bleeding  for fetal ultrasound. Remote read.  Findings:  1. Single intrauterine pregnancy with normal cardiac activity.  2. Posterior placenta without evidence of previa. No  retroplacental collection is seen.  3. Normal amniotic fluid volume.  4. Normal transabdominal cervical length.  5. Normal stomach and bladder are seen.  6. Fetus is in breech presentation. ---------------------------------------------------------------------- Recommendations  1. No previa.  2. Normal transabdominal cervical length.  3. Vaginal bleeding:  - management per primary OB ----------------------------------------------------------------------                Erle Crocker, MD Electronically Signed Final Report   07/14/2017 02:04 pm ----------------------------------------------------------------------  MDM Prenatal records from private office not on file. Pregnancy complicated by nothing at this time. Labs ordered and reviewed. UA, UC Wet prep and  gc/chlamydia ABO/Rh- O Pos CBC [place] MFM OB Limited  Consulted with Dr. Henderson Cloud regarding presentation and results- will PO hydrate and if uterine irritability resolves, can discharge patient home.   Irritability resolved. Patient still reporting no pain.   Assessment and Plan   1. Hematuria, unspecified type   2. [redacted] weeks gestation of pregnancy   3. Uterine irritability    -Discharge home in stable condition -Preterm labor precautions discussed -Patient advised to follow-up with [facility] as scheduled for prenatal care -Patient may return to MAU as needed or if her condition were to change or worsen.  Rolm Bookbinder CNM 07/14/2017, 1:03 PM

## 2017-07-14 NOTE — Discharge Instructions (Signed)
Braxton Hicks Contractions °Contractions of the uterus can occur throughout pregnancy, but they are not always a sign that you are in labor. You may have practice contractions called Braxton Hicks contractions. These false labor contractions are sometimes confused with true labor. °What are Braxton Hicks contractions? °Braxton Hicks contractions are tightening movements that occur in the muscles of the uterus before labor. Unlike true labor contractions, these contractions do not result in opening (dilation) and thinning of the cervix. Toward the end of pregnancy (32-34 weeks), Braxton Hicks contractions can happen more often and may become stronger. These contractions are sometimes difficult to tell apart from true labor because they can be very uncomfortable. You should not feel embarrassed if you go to the hospital with false labor. °Sometimes, the only way to tell if you are in true labor is for your health care provider to look for changes in the cervix. The health care provider will do a physical exam and may monitor your contractions. If you are not in true labor, the exam should show that your cervix is not dilating and your water has not broken. °If there are other health problems associated with your pregnancy, it is completely safe for you to be sent home with false labor. You may continue to have Braxton Hicks contractions until you go into true labor. °How to tell the difference between true labor and false labor °True labor °· Contractions last 30-70 seconds. °· Contractions become very regular. °· Discomfort is usually felt in the top of the uterus, and it spreads to the lower abdomen and low back. °· Contractions do not go away with walking. °· Contractions usually become more intense and increase in frequency. °· The cervix dilates and gets thinner. °False labor °· Contractions are usually shorter and not as strong as true labor contractions. °· Contractions are usually irregular. °· Contractions  are often felt in the front of the lower abdomen and in the groin. °· Contractions may go away when you walk around or change positions while lying down. °· Contractions get weaker and are shorter-lasting as time goes on. °· The cervix usually does not dilate or become thin. °Follow these instructions at home: °· Take over-the-counter and prescription medicines only as told by your health care provider. °· Keep up with your usual exercises and follow other instructions from your health care provider. °· Eat and drink lightly if you think you are going into labor. °· If Braxton Hicks contractions are making you uncomfortable: °? Change your position from lying down or resting to walking, or change from walking to resting. °? Sit and rest in a tub of warm water. °? Drink enough fluid to keep your urine pale yellow. Dehydration may cause these contractions. °? Do slow and deep breathing several times an hour. °· Keep all follow-up prenatal visits as told by your health care provider. This is important. °Contact a health care provider if: °· You have a fever. °· You have continuous pain in your abdomen. °Get help right away if: °· Your contractions become stronger, more regular, and closer together. °· You have fluid leaking or gushing from your vagina. °· You pass blood-tinged mucus (bloody show). °· You have bleeding from your vagina. °· You have low back pain that you never had before. °· You feel your baby’s head pushing down and causing pelvic pressure. °· Your baby is not moving inside you as much as it used to. °Summary °· Contractions that occur before labor are called Braxton   Hicks contractions, false labor, or practice contractions.  Braxton Hicks contractions are usually shorter, weaker, farther apart, and less regular than true labor contractions. True labor contractions usually become progressively stronger and regular and they become more frequent.  Manage discomfort from [facility] contractions by  changing position, resting in a warm bath, drinking plenty of water, or practicing deep breathing. This information is not intended to replace advice given to you by your health care provider. Make sure you discuss any questions you have with your health care provider. Document Released: 06/24/2016 Document Revised: 06/24/2016 Document Reviewed: 06/24/2016 Elsevier Interactive Patient Education  2018 [employer].   Hematuria, Adult Hematuria is blood in your urine. It can be caused by a bladder infection, kidney infection, prostate infection, kidney stone, or cancer of your urinary tract. Infections can usually be treated with medicine, and a kidney stone usually will pass through your urine. If neither of these is the cause of your hematuria, further workup to find out the reason may be needed. It is very important that you tell your health care provider about any blood you see in your urine, even if the blood stops without treatment or happens without causing pain. Blood in your urine that happens and then stops and then happens again can be a symptom of a very serious condition. Also, pain is not a symptom in the initial stages of many urinary cancers. Follow these instructions at home:  Drink lots of fluid, 3-4 quarts a day. If you have been diagnosed with an infection, cranberry juice is especially recommended, in addition to large amounts of water.  Avoid caffeine, tea, and carbonated beverages because they tend to irritate the bladder.  Avoid alcohol because it may irritate the prostate.  Take all medicines as directed by your health care provider.  If you were prescribed an antibiotic medicine, finish it all even if you start to feel better.  If you have been diagnosed with a kidney stone, follow your health care provider's instructions regarding straining your urine to catch the stone.  Empty your bladder often. Avoid holding urine for long periods of time.  After a bowel  movement, women should cleanse front to back. Use each tissue only once.  Empty your bladder before and after sexual intercourse if you are a female. Contact a health care provider if:  You develop back pain.  You have a fever.  You have a feeling of sickness in your stomach (nausea) or vomiting.  Your symptoms are not better in 3 days. Return sooner if you are getting worse. Get help right away if:  You develop severe vomiting and are unable to keep the medicine down.  You develop severe back or abdominal pain despite taking your medicines.  You begin passing a large amount of blood or clots in your urine.  You feel extremely weak or faint, or you pass out. This information is not intended to replace advice given to you by your health care provider. Make sure you discuss any questions you have with your health care provider. Document Released: 02/08/2005 Document Revised: 07/17/2015 Document Reviewed: 10/09/2012 Elsevier Interactive Patient Education  2017 [employer].

## 2017-07-15 LAB — CULTURE, OB URINE: CULTURE: NO GROWTH

## 2017-07-15 LAB — GC/CHLAMYDIA PROBE AMP (~~LOC~~) NOT AT ARMC
Chlamydia: NEGATIVE
Neisseria Gonorrhea: NEGATIVE

## 2017-07-22 DIAGNOSIS — O99810 Abnormal glucose complicating pregnancy: Secondary | ICD-10-CM | POA: Diagnosis not present

## 2017-08-15 DIAGNOSIS — J069 Acute upper respiratory infection, unspecified: Secondary | ICD-10-CM | POA: Diagnosis not present

## 2017-08-26 DIAGNOSIS — Z369 Encounter for antenatal screening, unspecified: Secondary | ICD-10-CM | POA: Diagnosis not present

## 2017-09-06 ENCOUNTER — Encounter

## 2017-09-06 DIAGNOSIS — Z369 Encounter for antenatal screening, unspecified: Secondary | ICD-10-CM | POA: Diagnosis not present

## 2017-09-06 DIAGNOSIS — Z3480 Encounter for supervision of other normal pregnancy, unspecified trimester: Secondary | ICD-10-CM | POA: Diagnosis not present

## 2017-09-16 DIAGNOSIS — Z369 Encounter for antenatal screening, unspecified: Secondary | ICD-10-CM | POA: Diagnosis not present

## 2017-09-19 ENCOUNTER — Other Ambulatory Visit: Payer: Self-pay

## 2017-09-19 ENCOUNTER — Inpatient Hospital Stay (HOSPITAL_COMMUNITY): Payer: BLUE CROSS/BLUE SHIELD | Admitting: Anesthesiology

## 2017-09-19 ENCOUNTER — Inpatient Hospital Stay (HOSPITAL_COMMUNITY)
Admission: AD | Admit: 2017-09-19 | Discharge: 2017-09-22 | DRG: 807 | Disposition: A | Discharge status: Home / Self Care | Payer: BLUE CROSS/BLUE SHIELD | Attending: Obstetrics and Gynecology | Admitting: Obstetrics and Gynecology

## 2017-09-19 ENCOUNTER — Encounter (HOSPITAL_COMMUNITY): Payer: Self-pay

## 2017-09-19 DIAGNOSIS — Z812 Family history of tobacco abuse and dependence: Secondary | ICD-10-CM | POA: Diagnosis not present

## 2017-09-19 DIAGNOSIS — Z3A37 37 weeks gestation of pregnancy: Secondary | ICD-10-CM | POA: Diagnosis not present

## 2017-09-19 DIAGNOSIS — O4292 Full-term premature rupture of membranes, unspecified as to length of time between rupture and onset of labor: Secondary | ICD-10-CM | POA: Diagnosis not present

## 2017-09-19 DIAGNOSIS — Z87891 Personal history of nicotine dependence: Secondary | ICD-10-CM

## 2017-09-19 DIAGNOSIS — Z23 Encounter for immunization: Secondary | ICD-10-CM | POA: Diagnosis not present

## 2017-09-19 DIAGNOSIS — Z8614 Personal history of Methicillin resistant Staphylococcus aureus infection: Secondary | ICD-10-CM | POA: Diagnosis not present

## 2017-09-19 DIAGNOSIS — O368130 Decreased fetal movements, third trimester, not applicable or unspecified: Secondary | ICD-10-CM | POA: Diagnosis not present

## 2017-09-19 LAB — CBC
HEMATOCRIT: 35.7 % — AB (ref 36.0–46.0)
Hemoglobin: 12.1 g/dL (ref 12.0–15.0)
MCH: 32.1 pg (ref 26.0–34.0)
MCHC: 33.9 g/dL (ref 30.0–36.0)
MCV: 94.7 fL (ref 78.0–100.0)
PLATELETS: 233 10*3/uL (ref 150–400)
RBC: 3.77 MIL/uL — AB (ref 3.87–5.11)
RDW: 14 % (ref 11.5–15.5)
WBC: 10.2 10*3/uL (ref 4.0–10.5)

## 2017-09-19 LAB — TYPE AND SCREEN
ABO/RH(D): O POS
Antibody Screen: NEGATIVE

## 2017-09-19 LAB — POCT FERN TEST: POCT FERN TEST: POSITIVE

## 2017-09-19 LAB — OB RESULTS CONSOLE HIV ANTIBODY (ROUTINE TESTING): HIV: NONREACTIVE

## 2017-09-19 LAB — OB RESULTS CONSOLE GC/CHLAMYDIA
Chlamydia: NEGATIVE
Gonorrhea: NEGATIVE

## 2017-09-19 LAB — OB RESULTS CONSOLE GBS: GBS: NEGATIVE

## 2017-09-19 LAB — OB RESULTS CONSOLE RUBELLA ANTIBODY, IGM: Rubella: IMMUNE

## 2017-09-19 LAB — OB RESULTS CONSOLE RPR: RPR: NONREACTIVE

## 2017-09-19 LAB — OB RESULTS CONSOLE HEPATITIS B SURFACE ANTIGEN: Hepatitis B Surface Ag: NEGATIVE

## 2017-09-19 MED ORDER — EPHEDRINE 5 MG/ML INJ
10.0000 mg | INTRAVENOUS | Status: DC | PRN
  Administered 2017-09-20: 10 mg via INTRAVENOUS

## 2017-09-19 MED ORDER — LIDOCAINE HCL (PF) 1 % IJ SOLN
30.0000 mL | INTRAMUSCULAR | Status: DC | PRN
  Administered 2017-09-20: 30 mL via SUBCUTANEOUS
  Filled 2017-09-19: qty 30

## 2017-09-19 MED ORDER — LACTATED RINGERS IV SOLN
INTRAVENOUS | Status: DC
  Administered 2017-09-19: 17:00:00 via INTRAVENOUS

## 2017-09-19 MED ORDER — ACETAMINOPHEN 325 MG PO TABS: 650.0000 mg | ORAL_TABLET | ORAL | Status: DC | PRN

## 2017-09-19 MED ORDER — TERBUTALINE SULFATE 1 MG/ML IJ SOLN
0.2500 mg | Freq: Once | INTRAMUSCULAR | Status: DC | PRN
  Filled 2017-09-19: qty 1

## 2017-09-19 MED ORDER — PHENYLEPHRINE 40 MCG/ML (10ML) SYRINGE FOR IV PUSH (FOR BLOOD PRESSURE SUPPORT)
80.0000 ug | PREFILLED_SYRINGE | INTRAVENOUS | Status: DC | PRN
  Filled 2017-09-19: qty 10

## 2017-09-19 MED ORDER — OXYTOCIN BOLUS FROM INFUSION
500.0000 mL | Freq: Once | INTRAVENOUS | Status: AC
  Administered 2017-09-20: 500 mL via INTRAVENOUS

## 2017-09-19 MED ORDER — OXYCODONE-ACETAMINOPHEN 5-325 MG PO TABS: 2.0000 | ORAL_TABLET | ORAL | Status: DC | PRN

## 2017-09-19 MED ORDER — EPHEDRINE 5 MG/ML INJ: 10.0000 mg | INTRAVENOUS | Status: DC | PRN

## 2017-09-19 MED ORDER — SOD CITRATE-CITRIC ACID 500-334 MG/5ML PO SOLN: 30.0000 mL | ORAL | Status: DC | PRN

## 2017-09-19 MED ORDER — LIDOCAINE HCL (PF) 1 % IJ SOLN
INTRAMUSCULAR | Status: DC | PRN
  Administered 2017-09-19: 3 mL via EPIDURAL
  Administered 2017-09-19: 2 mL via EPIDURAL
  Administered 2017-09-19: 5 mL via EPIDURAL

## 2017-09-19 MED ORDER — ONDANSETRON HCL 4 MG/2ML IJ SOLN: 4.0000 mg | Freq: Four times a day (QID) | INTRAMUSCULAR | Status: DC | PRN

## 2017-09-19 MED ORDER — OXYCODONE-ACETAMINOPHEN 5-325 MG PO TABS: 1.0000 | ORAL_TABLET | ORAL | Status: DC | PRN

## 2017-09-19 MED ORDER — DIPHENHYDRAMINE HCL 50 MG/ML IJ SOLN: 12.5000 mg | INTRAMUSCULAR | Status: DC | PRN

## 2017-09-19 MED ORDER — PHENYLEPHRINE 40 MCG/ML (10ML) SYRINGE FOR IV PUSH (FOR BLOOD PRESSURE SUPPORT): 80.0000 ug | PREFILLED_SYRINGE | INTRAVENOUS | Status: DC | PRN

## 2017-09-19 MED ORDER — FLEET ENEMA 7-19 GM/118ML RE ENEM: 1.0000 | ENEMA | RECTAL | Status: DC | PRN

## 2017-09-19 MED ORDER — OXYTOCIN 40 UNITS IN LACTATED RINGERS INFUSION - SIMPLE MED
2.5000 [IU]/h | INTRAVENOUS | Status: DC
  Filled 2017-09-19: qty 1000

## 2017-09-19 MED ORDER — LACTATED RINGERS IV SOLN: 500.0000 mL | INTRAVENOUS | Status: DC | PRN

## 2017-09-19 MED ORDER — LACTATED RINGERS IV SOLN: 500.0000 mL | Freq: Once | INTRAVENOUS | Status: DC

## 2017-09-19 MED ORDER — OXYTOCIN 40 UNITS IN LACTATED RINGERS INFUSION - SIMPLE MED: 1.0000 m[IU]/min | INTRAVENOUS | Status: DC

## 2017-09-19 MED ORDER — FENTANYL 2.5 MCG/ML BUPIVACAINE 1/10 % EPIDURAL INFUSION (WH - ANES)
14.0000 mL/h | INTRAMUSCULAR | Status: DC | PRN
  Administered 2017-09-19: 14 mL/h via EPIDURAL
  Filled 2017-09-19: qty 100

## 2017-09-19 MED ORDER — MISOPROSTOL 50MCG HALF TABLET
50.0000 ug | ORAL_TABLET | ORAL | Status: DC
  Administered 2017-09-19 (×2): 50 ug via BUCCAL
  Filled 2017-09-19 (×5): qty 1

## 2017-09-19 NOTE — Anesthesia Preprocedure Evaluation (Signed)
Anesthesia Evaluation  Patient identified by MRN, date of birth, ID band Patient awake    Reviewed: Allergy & Precautions, Patient's Chart, lab work & pertinent test results  Airway Mallampati: II       Dental   Pulmonary former smoker,    Pulmonary exam normal        Cardiovascular negative cardio ROS Normal cardiovascular exam     Neuro/Psych negative neurological ROS     GI/Hepatic negative GI ROS, Neg liver ROS, (+)     substance abuse (hx opiate abuse)  ,   Endo/Other  negative endocrine ROS  Renal/GU negative Renal ROS     Musculoskeletal   Abdominal   Peds  Hematology negative hematology ROS (+)   Anesthesia Other Findings   Reproductive/Obstetrics (+) Pregnancy                             Lab Results  Component Value Date   WBC 10.2 09/19/2017   HGB 12.1 09/19/2017   HCT 35.7 (L) 09/19/2017   MCV 94.7 09/19/2017   PLT 233 09/19/2017   Lab Results  Component Value Date   CREATININE 0.73 06/14/2014   BUN 16 06/14/2014   NA 137 06/14/2014   K 4.2 06/14/2014   CL 103 06/14/2014   CO2 25 06/14/2014    Anesthesia Physical Anesthesia Plan  ASA: II  Anesthesia Plan: Epidural   Post-op Pain Management:    Induction:   PONV Risk Score and Plan: Treatment may vary due to age or medical condition  Airway Management Planned: Natural Airway  Additional Equipment:   Intra-op Plan:   Post-operative Plan:   Informed Consent: I have reviewed the patients History and Physical, chart, labs and discussed the procedure including the risks, benefits and alternatives for the proposed anesthesia with the patient or authorized representative who has indicated his/her understanding and acceptance.     Plan Discussed with:   Anesthesia Plan Comments:         Anesthesia Quick Evaluation

## 2017-09-19 NOTE — Anesthesia Pain Management Evaluation Note (Signed)
  CRNA Pain Management Visit Note  Patient: Dana Gonzales, 27 y.o., female  "Hello I am a member of the anesthesia team at [facility]. We have an anesthesia team available at all times to provide care throughout the hospital, including epidural management and anesthesia for C-section. I don't know your plan for the delivery whether it a natural birth, water birth, IV sedation, nitrous supplementation, doula or epidural, but we want to meet your pain goals."   1.Was your pain managed to your expectations on prior hospitalizations?   No prior hospitalizations  2.What is your expectation for pain management during this hospitalization?     possible natural vs nitrous or epidural if needed. Does not want narcotics  3.How can we help you reach that goal? Pain control as desired  Record the patient's initial score and the patient's pain goal.   Pain: 0  Pain Goal: 8 [facility] wants you to be able to say your pain was always managed very well.  Jahan Friedlander 09/19/2017

## 2017-09-19 NOTE — H&P (Signed)
27 y.o. 6450w2d  G2P0010 comes in c/o LOF she thinks began about midnight last night but did not call office until this afternoon. She reported somewhat decreased fetal movement and no bleeding.  Past Medical History:  Diagnosis Date  . Family history of anesthesia complication    pt's mother has hx. of post-op N/V  . Hip deformity, acquired 12/2012   left  . History of MRSA infection 2007   face  . History of substance abuse    opiates - has been "clean" x 7 mos., per mother  . Kidney stones   . Substance abuse (HCC)    opiates-past    Past Surgical History:  Procedure Laterality Date  . LIPOSUCTION Left 01/01/2013   Procedure: RECONSTRUCTION OF THE LEFT HIP DEFECT WITH LIPOSUCTION ASSISTANCE;  Surgeon: Louisa SecondGerald Truesdale, MD;  Location: [facility];  Service: Plastics;  Laterality: Left;  Fat injection left hip  . TONSILLECTOMY AND ADENOIDECTOMY      OB History  Gravida Para Term Preterm AB Living  2 0 0 0 1 0  SAB TAB Ectopic Multiple Live Births  1 0 0 0      # Outcome Date GA Lbr Len/2nd Weight Sex Delivery Anes PTL Lv  2 Current           1 SAB             Social History   Socioeconomic History  . Marital status: Single    Spouse name: Not on file  . Number of children: Not on file  . Years of education: Not on file  . Highest education level: Not on file  Occupational History  . Not on file  Social Needs  . Financial resource strain: Not on file  . Food insecurity:    Worry: Not on file    Inability: Not on file  . Transportation needs:    Medical: Not on file    Non-medical: Not on file  Tobacco Use  . Smoking status: Former Smoker    Packs/day: 1.00    Years: 6.00    Pack years: 6.00    Types: Cigarettes    Last attempt to quit: 02/13/2017    Years since quitting: 0.5  . Smokeless tobacco: Never Used  Substance and Sexual Activity  . Alcohol use: No  . Drug use: No    Comment: HX. of substance abuse  . Sexual activity: Yes   Partners: Male    Birth control/protection: None  Lifestyle  . Physical activity:    Days per week: Not on file    Minutes per session: Not on file  . Stress: Not on file  Relationships  . Social connections:    Talks on phone: Not on file    Gets together: Not on file    Attends religious service: Not on file    Active member of club or organization: Not on file    Attends meetings of clubs or organizations: Not on file    Relationship status: Not on file  . Intimate partner violence:    Fear of current or ex partner: Not on file    Emotionally abused: Not on file    Physically abused: Not on file    Forced sexual activity: Not on file  Other Topics Concern  . Not on file  Social History Narrative  . Not on file   Macrobid [nitrofurantoin macrocrystal] and Ciprofloxacin hcl    Prenatal Transfer Tool  Maternal Diabetes: No Genetic  Screening: Declined Maternal Ultrasounds/Referrals: Normal Fetal Ultrasounds or other Referrals:  None Maternal Substance Abuse:  Yes:  Type: Other:  h/o, per pt clean 57mo Significant Maternal Medications:  None Significant Maternal Lab Results: Lab values include: Group B Strep negative  Other PNC: uncomplicated.    Vitals:   09/19/17 2243 09/19/17 2245 09/19/17 2250 09/19/17 2251  BP:  (!) 127/113 126/77   Pulse:  (!) 52 (!) 49 (!) 50  Resp:   20   Temp:      TempSrc:      SpO2: 99% 99% 98%   Weight:        Lungs/Cor:  NAD Abdomen:  soft, gravid Ex:  no cords, erythema SVE:  1/50/-1 at admission FHTs:  120, good STV, NST R Toco:  q 3-7   A/P   Admitted for PROM at term  GBS Neg  Given unfavorable cervix will start buccal cytotec for ripening and monitor closely for chorioamnionitis.  Other routine care.  Philip Aspen

## 2017-09-19 NOTE — MAU Note (Signed)
Started leaking some fluid last night.  Called dr today, was told to come in . No bleeding, no pain. Brownish/yellow tint to fluid- still coming- trickling. (7/28-29 at midnight)

## 2017-09-19 NOTE — Anesthesia Procedure Notes (Signed)
Epidural Patient location during procedure: OB  Staffing Anesthesiologist: Marcene DuosFitzgerald, Sophiya Morello, MD Performed: anesthesiologist   Preanesthetic Checklist Completed: patient identified, site marked, surgical consent, pre-op evaluation, timeout performed, IV checked, risks and benefits discussed and monitors and equipment checked  Epidural Patient position: sitting Prep: site prepped and draped and DuraPrep Patient monitoring: continuous pulse ox and blood pressure Approach: midline Location: L3-L4 Injection technique: LOR air  Needle:  Needle type: Tuohy  Needle gauge: 17 G Needle length: 9 cm and 9 Needle insertion depth: 6 cm Catheter type: closed end flexible Catheter size: 19 Gauge Catheter at skin depth: 12 (11-->12cm when laid in lat decub position.) cm Test dose: negative  Assessment Events: blood not aspirated, injection not painful, no injection resistance, negative IV test and no paresthesia

## 2017-09-20 ENCOUNTER — Encounter (HOSPITAL_COMMUNITY): Payer: Self-pay | Admitting: Obstetrics and Gynecology

## 2017-09-20 LAB — CBC
HEMATOCRIT: 33.8 % — AB (ref 36.0–46.0)
Hemoglobin: 11.6 g/dL — ABNORMAL LOW (ref 12.0–15.0)
MCH: 32.2 pg (ref 26.0–34.0)
MCHC: 34.3 g/dL (ref 30.0–36.0)
MCV: 93.9 fL (ref 78.0–100.0)
Platelets: 198 10*3/uL (ref 150–400)
RBC: 3.6 MIL/uL — ABNORMAL LOW (ref 3.87–5.11)
RDW: 13.9 % (ref 11.5–15.5)
WBC: 21.1 10*3/uL — ABNORMAL HIGH (ref 4.0–10.5)

## 2017-09-20 LAB — RPR: RPR: NONREACTIVE

## 2017-09-20 MED ORDER — SENNOSIDES-DOCUSATE SODIUM 8.6-50 MG PO TABS
2.0000 | ORAL_TABLET | ORAL | Status: DC
  Administered 2017-09-21 (×2): 2 via ORAL
  Filled 2017-09-20 (×2): qty 2

## 2017-09-20 MED ORDER — IBUPROFEN 600 MG PO TABS
600.0000 mg | ORAL_TABLET | Freq: Four times a day (QID) | ORAL | Status: DC
Start: 2017-09-20 — End: 2017-09-22
  Administered 2017-09-20 – 2017-09-22 (×8): 600 mg via ORAL
  Filled 2017-09-20 (×11): qty 1

## 2017-09-20 MED ORDER — PHENYLEPHRINE 40 MCG/ML (10ML) SYRINGE FOR IV PUSH (FOR BLOOD PRESSURE SUPPORT)
80.0000 ug | PREFILLED_SYRINGE | INTRAVENOUS | Status: DC | PRN
  Filled 2017-09-20: qty 5

## 2017-09-20 MED ORDER — DIBUCAINE 1 % RE OINT
1.0000 "application " | TOPICAL_OINTMENT | RECTAL | Status: DC | PRN
  Administered 2017-09-22: 1 via RECTAL
  Filled 2017-09-20: qty 28

## 2017-09-20 MED ORDER — LACTATED RINGERS IV SOLN: 500.0000 mL | Freq: Once | INTRAVENOUS | Status: DC

## 2017-09-20 MED ORDER — COCONUT OIL OIL: 1.0000 "application " | TOPICAL_OIL | Status: DC | PRN

## 2017-09-20 MED ORDER — WITCH HAZEL-GLYCERIN EX PADS
1.0000 "application " | MEDICATED_PAD | CUTANEOUS | Status: DC | PRN
  Administered 2017-09-22: 1 via TOPICAL

## 2017-09-20 MED ORDER — EPHEDRINE 5 MG/ML INJ
10.0000 mg | INTRAVENOUS | Status: DC | PRN
  Filled 2017-09-20: qty 2

## 2017-09-20 MED ORDER — TETANUS-DIPHTH-ACELL PERTUSSIS 5-2.5-18.5 LF-MCG/0.5 IM SUSP: 0.5000 mL | Freq: Once | INTRAMUSCULAR | Status: DC

## 2017-09-20 MED ORDER — PRENATAL MULTIVITAMIN CH
1.0000 | ORAL_TABLET | Freq: Every day | ORAL | Status: DC
  Administered 2017-09-20 – 2017-09-22 (×2): 1 via ORAL
  Filled 2017-09-20 (×3): qty 1

## 2017-09-20 MED ORDER — DIPHENHYDRAMINE HCL 25 MG PO CAPS: 25.0000 mg | ORAL_CAPSULE | Freq: Four times a day (QID) | ORAL | Status: DC | PRN

## 2017-09-20 MED ORDER — LACTATED RINGERS AMNIOINFUSION
INTRAVENOUS | Status: DC
  Administered 2017-09-20: 02:00:00 via INTRAUTERINE
  Filled 2017-09-20 (×2): qty 1000

## 2017-09-20 MED ORDER — BENZOCAINE-MENTHOL 20-0.5 % EX AERO
1.0000 "application " | INHALATION_SPRAY | CUTANEOUS | Status: DC | PRN
  Administered 2017-09-20: 1 via TOPICAL
  Filled 2017-09-20: qty 56

## 2017-09-20 MED ORDER — ONDANSETRON HCL 4 MG/2ML IJ SOLN: 4.0000 mg | INTRAMUSCULAR | Status: DC | PRN

## 2017-09-20 MED ORDER — SIMETHICONE 80 MG PO CHEW: 80.0000 mg | CHEWABLE_TABLET | ORAL | Status: DC | PRN

## 2017-09-20 MED ORDER — FENTANYL 2.5 MCG/ML BUPIVACAINE 1/10 % EPIDURAL INFUSION (WH - ANES): 14.0000 mL/h | INTRAMUSCULAR | Status: DC | PRN

## 2017-09-20 MED ORDER — DIPHENHYDRAMINE HCL 50 MG/ML IJ SOLN: 12.5000 mg | INTRAMUSCULAR | Status: DC | PRN

## 2017-09-20 MED ORDER — ZOLPIDEM TARTRATE 5 MG PO TABS: 5.0000 mg | ORAL_TABLET | Freq: Every evening | ORAL | Status: DC | PRN

## 2017-09-20 MED ORDER — ONDANSETRON HCL 4 MG PO TABS: 4.0000 mg | ORAL_TABLET | ORAL | Status: DC | PRN

## 2017-09-20 MED ORDER — ACETAMINOPHEN 325 MG PO TABS: 650.0000 mg | ORAL_TABLET | ORAL | Status: DC | PRN

## 2017-09-20 NOTE — Anesthesia Postprocedure Evaluation (Signed)
Anesthesia Post Note  Patient: Dana Gonzales  Procedure(s) Performed: AN AD HOC LABOR EPIDURAL     Patient location during evaluation: Mother Baby Anesthesia Type: Epidural Level of consciousness: awake and alert Pain management: pain level controlled Vital Signs Assessment: post-procedure vital signs reviewed and stable Respiratory status: spontaneous breathing, nonlabored ventilation and respiratory function stable Cardiovascular status: stable Postop Assessment: no headache, no backache, epidural receding, able to ambulate, adequate PO intake, no apparent nausea or vomiting and patient able to bend at knees Anesthetic complications: no    Last Vitals:  Vitals:   09/20/17 0500 09/20/17 0556  BP: 104/65 97/64  Pulse: (!) 55 (!) 55  Resp: 18 18  Temp: 36.9 C 36.8 C  SpO2:      Last Pain:  Vitals:   09/20/17 0720  TempSrc:   PainSc: 0-No pain   Pain Goal:                 Land O'LakesMalinova,Kizzy Olafson Hristova

## 2017-09-20 NOTE — Lactation Note (Addendum)
This note was copied from a baby's chart. Lactation Consultation Note  Patient Name: Dana Gonzales EAVWU'JToday's Date: 09/20/2017 Reason for consult: Follow-up assessment  2nd LC visit at 1430,  Baby awake, rooting, showing feeding cues.  LC 1st checked diaper / dry  LC spoon fed 3 ml of EBM while mom was holding the baby, baby tolerated well and took 3 ml. LC assisted mom to latch on the right breast / football/ baby latched with depth , few swallows and  Sustained a latch for 5 mins , released, and LC had m om hold the baby upright while LC finger fed with curved  Tip syringe 9 ml and baby tolerated well/ no spit up. Total supplement 12 ml  Of EBM .  Lab tech into draw the blood for the glucose and mom was holding baby.  LC reviewed the importance of STS feedings, hand expressing, and following feeding plan for Late preterm / Early term infant.  And post pumping after at least 5-6 feedings/ save milk for next feeding.     Maternal Data Has patient been taught Hand Expression?: Yes(mom able to hand express drops prior to latch ) Does the patient have breastfeeding experience prior to this delivery?: No  Feeding Feeding Type: Breast Milk Length of feed: 5 min(few swallows )  LATCH Score Latch: Repeated attempts needed to sustain latch, nipple held in mouth throughout feeding, stimulation needed to elicit sucking reflex.  Audible Swallowing: A few with stimulation  Type of Nipple: Everted at rest and after stimulation  Comfort (Breast/Nipple): Soft / non-tender  Hold (Positioning): Assistance needed to correctly position infant at breast and maintain latch.  LATCH Score: 7  Interventions Interventions: Breast feeding basics reviewed  Lactation Tools Discussed/Used Tools: Shells;Pump Shell Type: Inverted Breast pump type: Double-Electric Breast Pump WIC Program: No Pump Review: Milk Storage   Consult Status Consult Status: Follow-up Date: 09/21/17 Follow-up type:  In-patient    Dana Gonzales 09/20/2017, 3:18 PM

## 2017-09-20 NOTE — Lactation Note (Addendum)
This note was copied from a baby's chart. Lactation Consultation Note  Patient Name: Dana Gonzales WUJWJ'XToday's Date: 09/20/2017 Reason for consult: Initial assessment;Early term 37-38.6wks;Infant < 6lbs  Baby is 11 hours old  LC reviewed doc flow sheets - Breast fed x 5 ( 7-15 mins ) Latch scores 7-8's. Blood glucose had decreased earlier at 1040 and needed glucose gel x1, blood sugar responded  And increased to 54 and and since has breast fed. [facility] and mom pumped at 1330 With results.  LC will be seeing mom around 1430 for feeding.   Mother informed of post-discharge support and given phone number to the lactation department, including services for phone call assistance; out-patient appointments; and breastfeeding support group. List of other breastfeeding resources in the community given in the handout. Encouraged mother to call for problems or concerns related to breastfeeding.   Maternal Data Has patient been taught Hand Expression?: Yes(pe r[facility] ) Does the patient have breastfeeding experience prior to this delivery?: No  Feeding Feeding Type: Breast Fed Length of feed: 10 min  LATCH Score                   Interventions Interventions: Breast feeding basics reviewed  Lactation Tools Discussed/Used Tools: Pump Breast pump type: Double-Electric Breast Pump WIC Program: No Pump Review: Milk Storage   Consult Status Consult Status: Follow-up Date: 09/20/17 Follow-up type: In-patient    Matilde SprangMargaret Ann Audree Schrecengost 09/20/2017, 2:09 PM

## 2017-09-20 NOTE — Progress Notes (Signed)
Late preterm infant information sheet reviewed with pt.  Mom set up with breast pump and instructed to pump for 15 minutes after each breast feeding session.Nurse reviewed set up and care of pump.  Pt drowsy and will need reinforcement on the breast pump.  Nurse will pass on in report to on coming Nurse.  LC to follow up.  Mom given shells to place in bra to assist with nipple inversion.  Pt not currently wearing bra.  Pt instructed to wear bra.

## 2017-09-21 NOTE — Lactation Note (Signed)
This note was copied from a baby's chart. Lactation Consultation Note  Patient Name: Dana Gonzales Reason for consult: Follow-up assessment;Infant < 6lbs;Early term 37-38.6wks  P1 mother whose infant is now 5538 hours old.  Mother is not going to be discharged today due to weight loss.  Baby breastfeeding as I arrived.  Mother was holding him in the cradle position.  I reminded her to be sure the latch was deep and that she held him close to the breast.  Mother felt tugging but no pain.  I also suggested the football hold as an alternative or the cross cradle hold.  She prefers the cradle hold at this time.  Encouraged diligent feeding and supplementing today and tonight to help minimize weight loss in the morning.  I explained the importance of feeding 8-12 times/24 hours or sooner if he shows feeding cues.  She will awaken at the three hour interval if he has not self awakened.  Mother has been pumping after feedings with the DEBP and supplementing with her EBM.  Reminded her that the guidelines for supplementation are 7-12 mls at least and when he turns 48 hours the amount will increase to 18-25 mls.  Mother does not want to use any formula supplementation.  She will be diligent about pumping throughout the night.  Encouraged continued STS, breast massage, hand expression before/after feedings and low lights with little visitor stimulation.  Parent in agreement with this plan and very receptive to helpful hints.  They will probably start using the curved tip syringe tonight for supplementing and I encouraged them to call for assistance if needed.  They have observed this one time.  Mother will call as needed.     Maternal Data Formula Feeding for Exclusion: No Has patient been taught Hand Expression?: Yes Does the patient have breastfeeding experience prior to this delivery?: No  Feeding Feeding Type: Breast Fed Length of feed: 10 min(feeding prior to my arrival  and still feeding when I left the room)  LATCH Score Latch: Grasps breast easily, tongue down, lips flanged, rhythmical sucking.  Audible Swallowing: A few with stimulation  Type of Nipple: Everted at rest and after stimulation  Comfort (Breast/Nipple): Soft / non-tender  Hold (Positioning): No assistance needed to correctly position infant at breast.  LATCH Score: 9  Interventions Interventions: Breast feeding basics reviewed;Assisted with latch;Skin to skin;Breast massage;Hand express;Expressed milk;Position options;Support pillows;Adjust position;Breast compression;Shells;DEBP  Lactation Tools Discussed/Used Tools: Pump Shell Type: Inverted Breast pump type: Double-Electric Breast Pump   Consult Status Consult Status: Follow-up Date: 09/22/17 Follow-up type: In-patient    Dana Gonzales Gonzales, 5:39 PM

## 2017-09-21 NOTE — Progress Notes (Signed)
Post Partum Day 1 Subjective: no complaints, up ad lib, voiding and tolerating PO  Objective: Blood pressure 105/69, pulse (!) 43, temperature 97.7 F (36.5 C), resp. rate 18, weight 69.9 kg (154 lb), SpO2 99 %, unknown if currently breastfeeding.  Physical Exam:  General: alert, cooperative and appears stated age Lochia: appropriate Uterine Fundus: firm   Recent Labs    09/19/17 1618 09/20/17 0652  HGB 12.1 11.6*  HCT 35.7* 33.8*    Assessment/Plan: Plan for discharge tomorrow  Breastfeeding Desires neonatal circumcision, R/B/A of procedure discussed at length. Pt understands that neonatal circumcision is not considered medically necessary and is elective. The risks include, but are not limited to bleeding, infection, damage to the penis, development of scar tissue, and having to have it redone at a later date. Pt understands theses risks and wishes to proceed    LOS: 2 days   Waynard ReedsKendra Lanier Millon 09/21/2017, 10:02 AM

## 2017-09-22 NOTE — Progress Notes (Signed)
Patient is eating, ambulating, voiding.  Pain control is good.  Vitals:   09/21/17 0554 09/21/17 1355 09/21/17 2206 09/22/17 0609  BP: 105/69 108/82 104/66 (!) 131/93  Pulse: (!) 43 (!) 51 (!) 50 63  Resp: 18     Temp: 97.7 F (36.5 C) 97.7 F (36.5 C)  98.6 F (37 C)  TempSrc:  Oral  Oral  SpO2:  98%    Weight:        Fundus firm Perineum without swelling.  Lab Results  Component Value Date   WBC 21.1 (H) 09/20/2017   HGB 11.6 (L) 09/20/2017   HCT 33.8 (L) 09/20/2017   MCV 93.9 09/20/2017   PLT 198 09/20/2017    --/--/O POS (07/29 1619)/RI  A/P Post partum day 2.  Routine care.  Expect d/c today.    Lang Zingg A

## 2017-09-22 NOTE — Discharge Summary (Signed)
Obstetric Discharge Summary Reason for Admission: rupture of membranes Prenatal Procedures: none Intrapartum Procedures: spontaneous vaginal delivery Postpartum Procedures: none Complications-Operative and Postpartum: none Hemoglobin  Date Value Ref Range Status  09/20/2017 11.6 (L) 12.0 - 15.0 g/dL Final   HCT  Date Value Ref Range Status  09/20/2017 33.8 (L) 36.0 - 46.0 % Final     Discharge Diagnoses: Term Pregnancy-delivered  Discharge Information: Date: 09/22/2017 Activity: pelvic rest Diet: routine Medications: Ibuprofen Condition: stable Instructions: refer to practice specific booklet Discharge to: home Follow-up Information    Philip AspenCallahan, Sidney, DO Follow up in 4 week(s).   Specialty:  Obstetrics and Gynecology Contact information: [address] [place] [address] [phone]           Newborn Data: Live born female  Birth Weight: 5 lb 8 oz (2495 g) APGAR: 8, 9  Newborn Delivery   Birth date/time:  09/20/2017 02:45:00 Delivery type:  Vaginal, Spontaneous     Home with mother.  Steadman Prosperi A 09/22/2017, 7:41 AM

## 2017-09-22 NOTE — Lactation Note (Signed)
This note was copied from a baby's chart. Lactation Consultation Note  Patient Name: Dana Gonzales WJXBJ'YToday's Date: 09/22/2017 Reason for consult: Early term 37-38.6wks;Infant < 6lbs;Follow-up assessment;Infant weight loss  P1 mother whose infant is now 1954 hours old.  Infant has an 8% weight loss today.  Spoke with parents regarding breastfeeding and weight loss.  RN in room and we will begin supplementation with Neosure 22 cal. Explained to parents the reason for supplementation and the necessity to begin this.  They verbalized understanding and are willing to do whatever it takes to help their baby.    Mother will breastfeed 8-12 times/24 hours or more if baby shows feeding cues.  Parents aware of feeding cues.  Mother will continue to pump with the DEBP after breastfeeding and feed back any EBM she may obtain from pumping.  We will also supplement with at least 18-25 mls of Neosure and more if he desires.  This amount will be maintained until he turns 73 hours of life.  Showed parents the feeding supplementation guidlelines and they understand how to follow it.  Demonstrated the curved tip syringe for the parents and baby has a good strong suck and could pull the formula well from the syringe.  After demonstrating, mom did a return demonstration and was able to learn how to position her finger for good sucking.  Father held baby and encouraged mother during the feeding.  The parents work well together and are very supportive of each other.  I encouraged continued STS, breast massage and hand expression after feedings.  Parents will awaken baby at the 3 hour interval if he does not self awaken.  Also discussed the importance of "down time" for baby when he is not feeding.  Keep stimulation to a minimum, visitors to a minimum, dim room and quiet environment to conserve caloried and help decrease weight loss.    Dr. Ezequiel EssexGable in room during feeding and will be closely following to determine possible  discharge.  Parents already have a follow up pediatrician's visit for tomorrow.  Parents will call for assistance as needed and I will hope to observe the next breast feeding.  RN updtaed.   Maternal Data Formula Feeding for Exclusion: No Has patient been taught Hand Expression?: Yes Does the patient have breastfeeding experience prior to this delivery?: No  Feeding Feeding Type: Formula Length of feed: 15 min  LATCH Score                   Interventions    Lactation Tools Discussed/Used Pump Review: Milk Storage Initiated by:: Laureen OchsBeth Glena Pharris Date initiated:: 09/23/17   Consult Status Consult Status: Follow-up Date: 09/23/17 Follow-up type: In-patient    [name] 09/22/2017, 9:24 AM

## 2017-11-04 DIAGNOSIS — Z30430 Encounter for insertion of intrauterine contraceptive device: Secondary | ICD-10-CM | POA: Diagnosis not present

## 2017-11-04 DIAGNOSIS — Z6823 Body mass index (BMI) 23.0-23.9, adult: Secondary | ICD-10-CM | POA: Diagnosis not present

## 2017-11-04 DIAGNOSIS — Z3202 Encounter for pregnancy test, result negative: Secondary | ICD-10-CM | POA: Diagnosis not present

## 2017-12-24 DIAGNOSIS — M791 Myalgia, unspecified site: Secondary | ICD-10-CM | POA: Diagnosis not present

## 2017-12-24 DIAGNOSIS — R05 Cough: Secondary | ICD-10-CM | POA: Diagnosis not present

## 2017-12-24 DIAGNOSIS — J019 Acute sinusitis, unspecified: Secondary | ICD-10-CM | POA: Diagnosis not present

## 2017-12-24 DIAGNOSIS — R0981 Nasal congestion: Secondary | ICD-10-CM | POA: Diagnosis not present

## 2018-03-15 DIAGNOSIS — N3000 Acute cystitis without hematuria: Secondary | ICD-10-CM | POA: Diagnosis not present

## 2018-03-29 DIAGNOSIS — Z6820 Body mass index (BMI) 20.0-20.9, adult: Secondary | ICD-10-CM | POA: Diagnosis not present

## 2018-03-29 DIAGNOSIS — Z01419 Encounter for gynecological examination (general) (routine) without abnormal findings: Secondary | ICD-10-CM | POA: Diagnosis not present

## 2018-06-02 IMAGING — US US RENAL
1 series · 15 of 25 positions shown · non-contrast
Comparison: CT 06/24/2008

CLINICAL DATA: Right flank pain, 24 weeks pregnant

EXAM:
RENAL / URINARY TRACT ULTRASOUND COMPLETE

[Series 1: us renal · 15 of 36 slices shown]
[im 1/36]
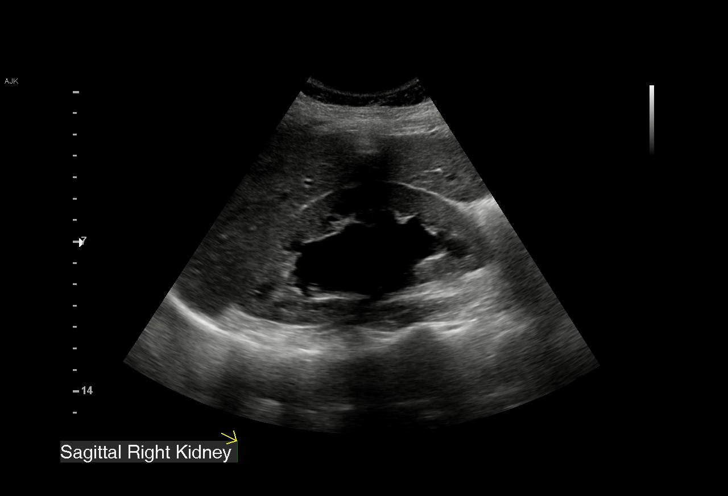
[im 3/36]
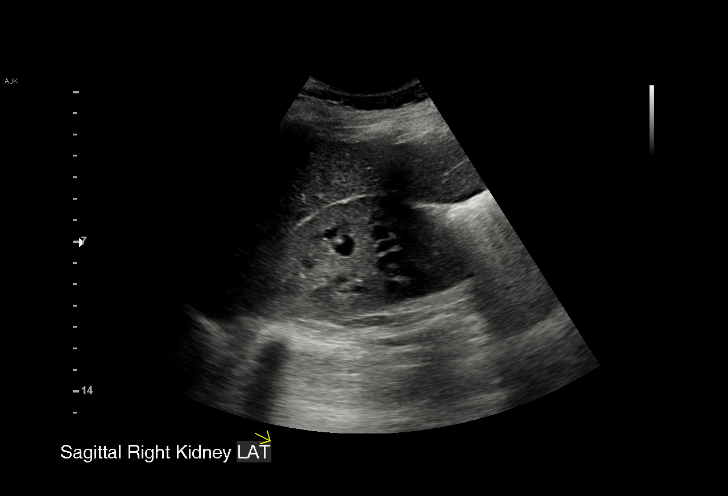
[im 6/36]
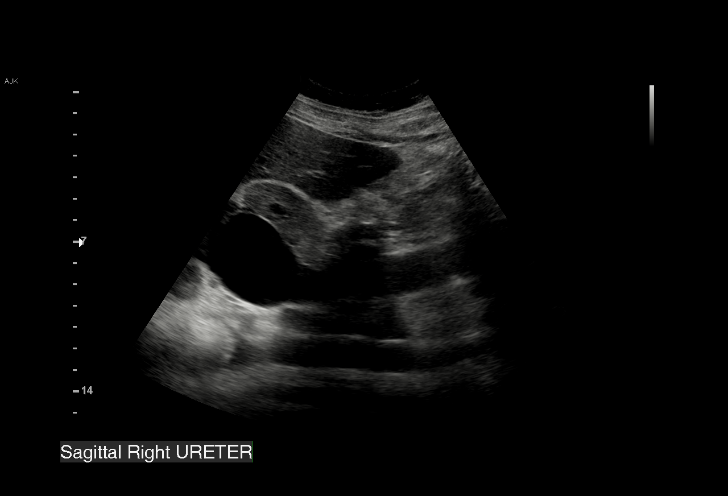
[im 8/36]
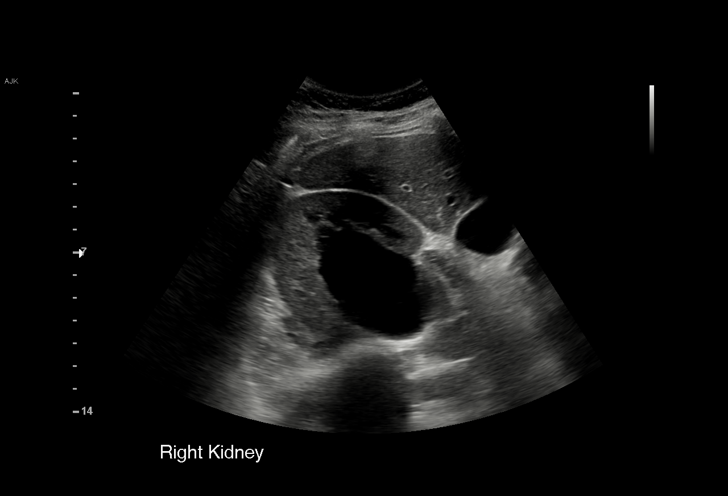
[im 11/36]
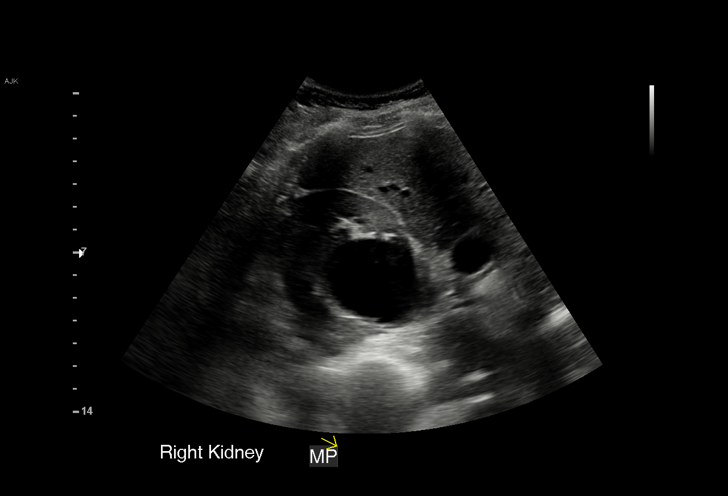
[im 14/36]
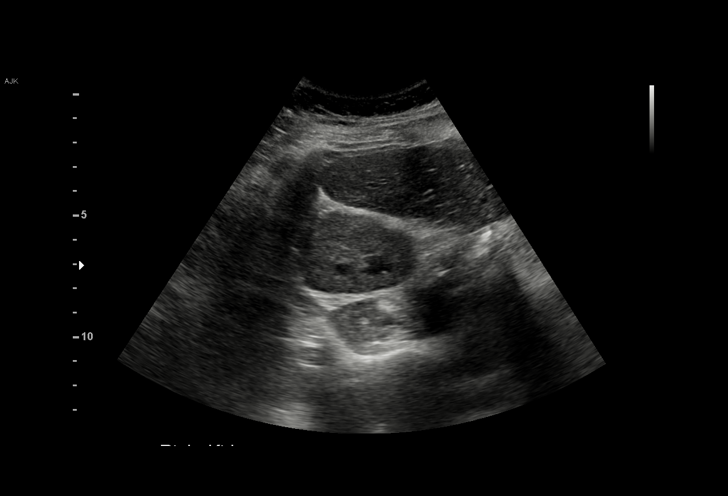
[im 15/36]
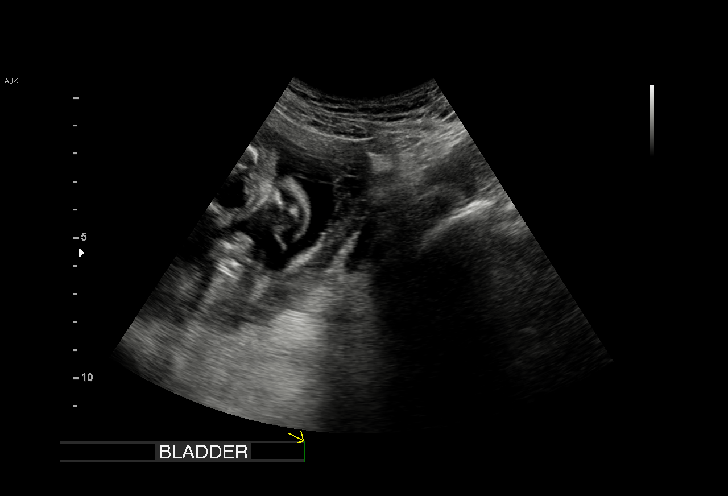
[im 18/36]
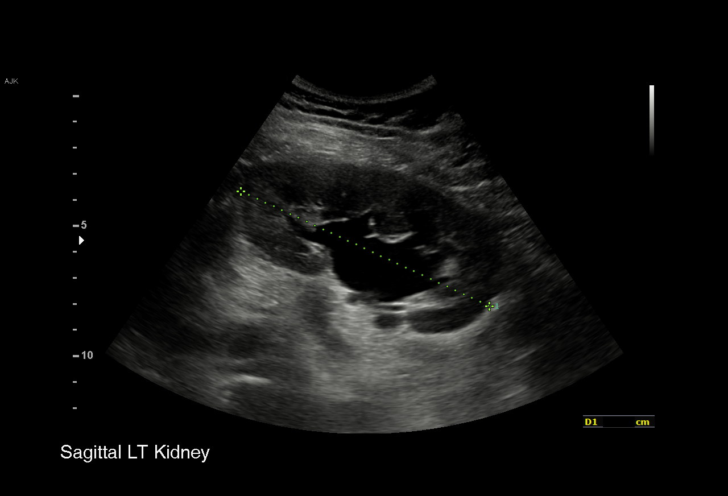
[im 21/36]
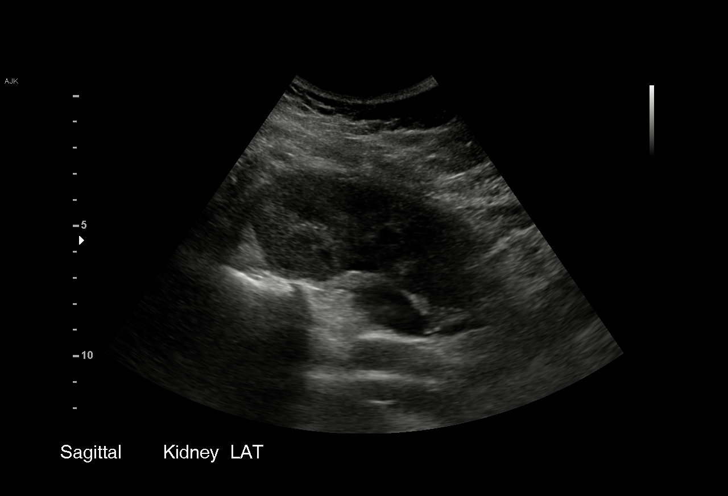
[im 22/36]
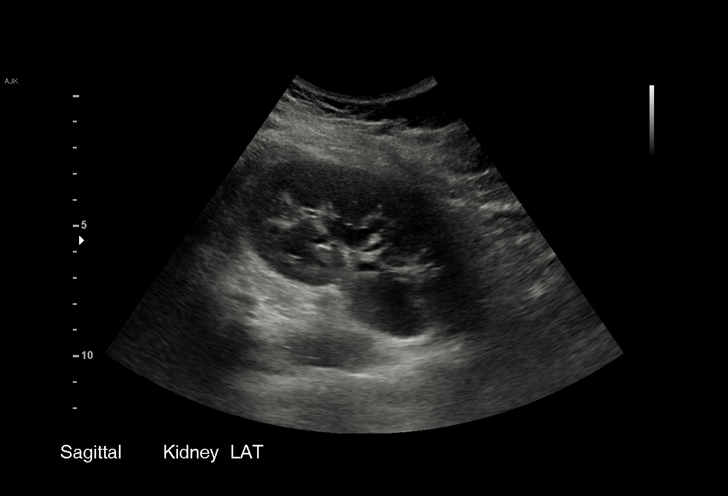
[im 25/36]
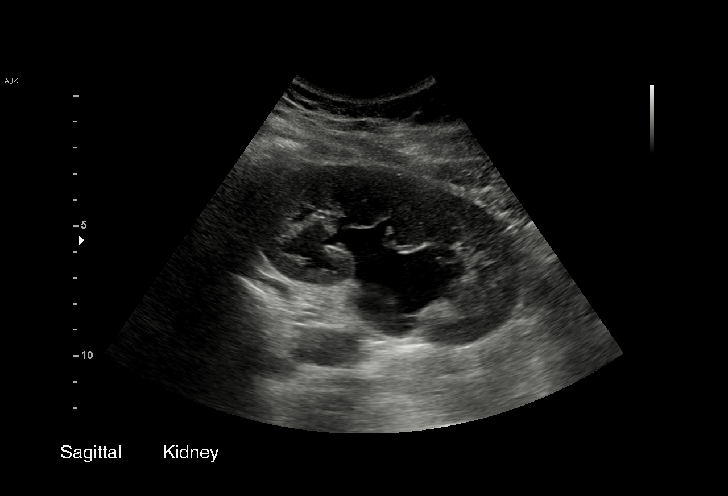
[im 28/36]
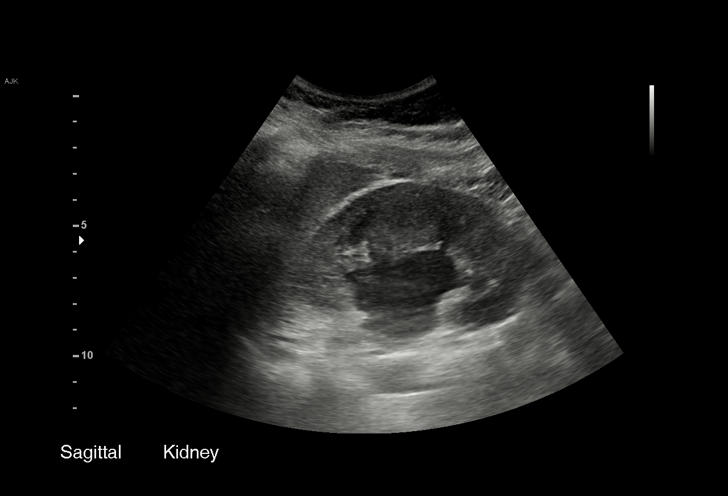
[im 30/36]
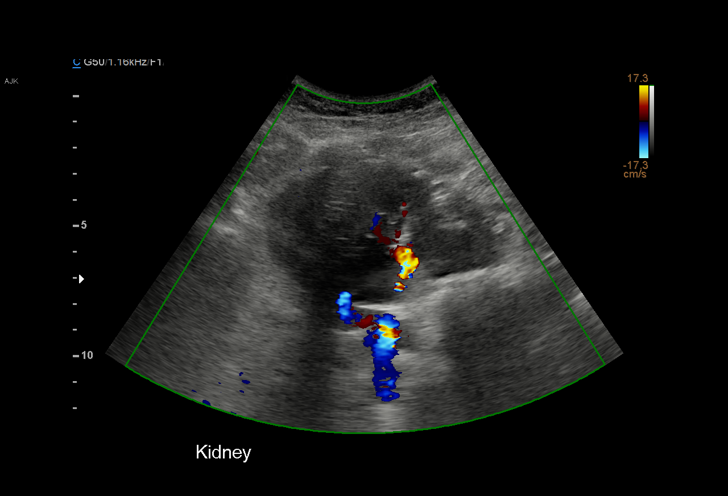
[im 33/36]
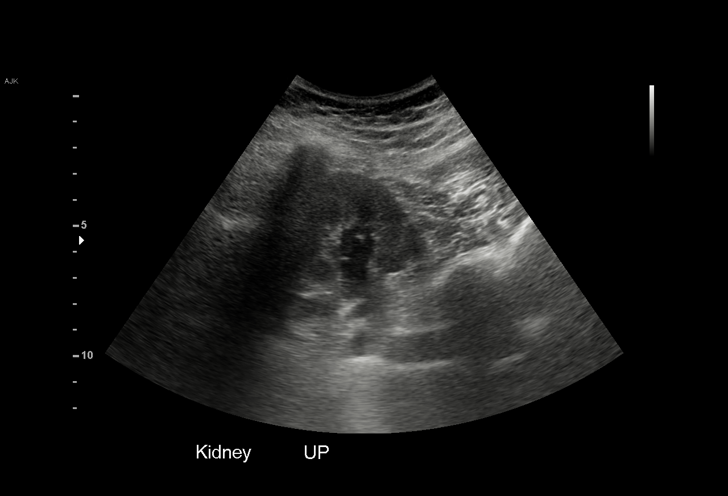
[im 36/36]
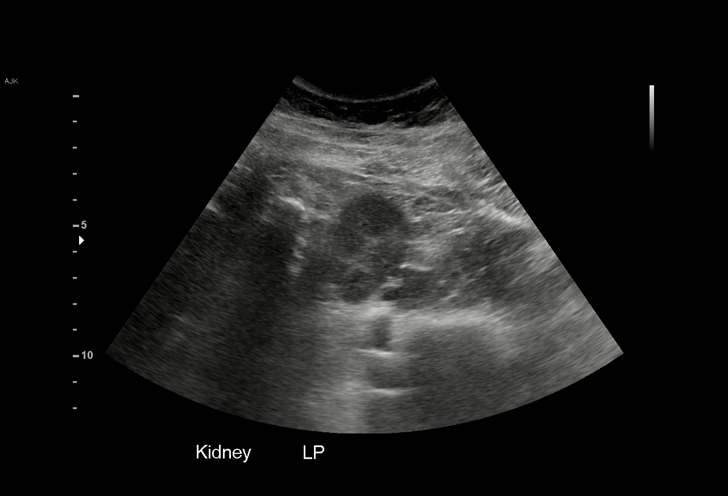

[15 of 25 positions shown; findings below may reference images not displayed]

FINDINGS: Right Kidney:

Length: 12.4 cm. Echogenicity within normal limits. Moderate right
hydronephrosis.

Left Kidney:

Length: 11 cm. Echogenicity within normal limits. Moderate left
hydronephrosis.

Bladder:

Limited evaluation due to empty bladder.
IMPRESSION: Moderate bilateral hydronephrosis.

## 2021-10-28 NOTE — Progress Notes (Signed)
Synopsis: Referred for chronic cough by Blane Ohara, MD  Subjective:   PATIENT ID: Dana Gonzales GENDER: female DOB: Apr 24, 1990, MRN: [id number]  Chief Complaint  Patient presents with   Pulmonary Consult    Self referral. Cough since June 2023- multiple rounds of abx have been taken. Singulair and albuterol have helped most recently.    31yF with history of remote opioid use, covid-19 a year ago mild referred for chronic cough  She says since mid June she thought she had a sinus infection and related cough. Had trouble sleeping due to it. 3 rounds of ABX/steroids. She did have good response to steroids. Takes singulair and zyrtec at night and thinks she is much improved. Did kickboxing class and it went fine on Sunday. She has no sinus congestion now. No heartburn/reflux.   Has never had sinus surgery, asthma as a kid. Did have eczema as a kid that required steroid creams probably.   Otherwise pertinent review of systems is negative.  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 Vaping sporadically, MJ till 2015.  She has 2 dogs. No pet birds.     Past Medical History:  Diagnosis Date   Family history of anesthesia complication    pt's mother has hx. of post-op N/V   Hip deformity, acquired 12/2012   left   History of MRSA infection 2007   face   History of substance abuse (HCC)    opiates - has been "clean" x 7 mos., per mother   Kidney stones    Substance abuse (HCC)    opiates-past     Family History  Problem Relation Age of Onset   Anesthesia problems Mother        post-op N/V   Thyroid disease Mother    Asthma Mother    Cancer Maternal Grandmother        lung   Diabetes Paternal Grandfather      Past Surgical History:  Procedure Laterality Date   LIPOSUCTION Left 01/01/2013   Procedure: RECONSTRUCTION OF THE LEFT HIP DEFECT WITH LIPOSUCTION ASSISTANCE;  Surgeon: Louisa Second, MD;  Location: [facility];  Service: Plastics;  Laterality: Left;  Fat injection left hip   TONSILLECTOMY AND ADENOIDECTOMY      Social History   Socioeconomic History   Marital status: Single    Spouse name: Not on file   Number of children: Not on file   Years of education: Not on file   Highest education level: Not on file  Occupational History   Not on file  Tobacco Use   Smoking status: Former    Packs/day: 1.00    Years: 6.00    Total pack years: 6.00    Types: Cigarettes    Quit date: 02/13/2017    Years since quitting: 4.7   Smokeless tobacco: Never  Vaping Use   Vaping Use: Former  Substance and Sexual Activity   Alcohol use: No   Drug use: No    Comment: HX. of substance abuse   Sexual activity: Yes    Partners: Male    Birth control/protection: None  Other Topics Concern   Not on file  Social History Narrative   Not on file   Social Determinants of Health   Financial Resource Strain: Not on file  Food Insecurity: Not on file  Transportation Needs: Not on file  Physical Activity: Not on file  Stress: Not on file  Social Connections: Not on file  Intimate Partner Violence: Not on file     Allergies  Allergen Reactions   Macrobid [Nitrofurantoin Macrocrystal] Nausea And Vomiting   Ciprofloxacin Hcl Nausea And Vomiting     Outpatient Medications Prior to Visit  Medication Sig Dispense Refill   albuterol (PROVENTIL) (2.5 MG/3ML) 0.083% nebulizer solution Take 2.5 mg by nebulization every 3 (three) hours as needed.     albuterol (VENTOLIN HFA) 108 (90 Base) MCG/ACT inhaler Inhale 2 puffs into the lungs every 4 (four) hours as needed.     cetirizine (ZYRTEC) 10 MG tablet Take 10 mg by mouth daily as needed for allergies.     levonorgestrel (MIRENA) 20 MCG/DAY IUD 1 each by Intrauterine route once.     montelukast (SINGULAIR) 10 MG tablet Take 10 mg by mouth daily.     Multiple Vitamins-Minerals (MULTIVITAMIN WITH MINERALS) tablet Take 1 tablet by mouth daily.      calcium carbonate (TUMS - DOSED IN MG ELEMENTAL CALCIUM) 500 MG chewable tablet Chew 1-2 tablets by mouth daily.     Prenatal Vit-Fe Fumarate-FA (PRENATAL MULTIVITAMIN) TABS tablet Take 1 tablet by mouth daily at 12 noon.     ranitidine (ZANTAC) 150 MG capsule Take 150 mg by mouth 2 (two) times daily.     No facility-administered medications prior to visit.       Objective:   Physical Exam:  General appearance: 31 y.o., female, NAD, conversant  Eyes: anicteric sclerae; PERRL, tracking appropriately HENT: NCAT; MMM Neck: Trachea midline; no lymphadenopathy, no JVD Lungs: CTAB, no crackles, no wheeze, with normal respiratory effort CV: RRR, no murmur  Abdomen: Soft, non-tender; non-distended, BS present  Extremities: No peripheral edema, warm Skin: Normal turgor and texture; no rash Psych: Appropriate affect Neuro: Alert and oriented to person and place, no focal deficit     Vitals:   10/29/21 1507  BP: 104/68  Pulse: 93  Temp: 98.8 F (37.1 C)  TempSrc: Oral  SpO2: 99%  Weight: 110 lb (49.9 kg)  Height: 5\' 3"  (1.6 m)   99% on RA BMI Readings from Last 3 Encounters:  10/29/21 19.49 kg/m  09/19/17 27.28 kg/m  07/14/17 24.62 kg/m   Wt Readings from Last 3 Encounters:  10/29/21 110 lb (49.9 kg)  09/19/17 154 lb (69.9 kg)  07/14/17 139 lb (63 kg)     CBC    Component Value Date/Time   WBC 21.1 (H) 09/20/2017 0652   RBC 3.60 (L) 09/20/2017 0652   HGB 11.6 (L) 09/20/2017 0652   HCT 33.8 (L) 09/20/2017 0652   PLT 198 09/20/2017 0652   MCV 93.9 09/20/2017 0652   MCH 32.2 09/20/2017 0652   MCHC 34.3 09/20/2017 0652   RDW 13.9 09/20/2017 0652    Chest Imaging: CXR reportedly normal   Pulmonary Functions Testing Results:     No data to display              Assessment & Plan:   # Episodic dyspnea # Subacute cough Strong response to prednisone, singulair suggestive of asthma alternatively consider covid related small airways  disease.  Plan: - pre/post bronchodilator spirometry - albuterol prn - OK to continue singulair up to a week before PFT but fine to resume if feeling bad off of it   09/22/2017, MD [facility] 10/29/2021 3:27 PM

## 2021-10-29 ENCOUNTER — Encounter: Payer: Self-pay | Admitting: Student

## 2021-10-29 ENCOUNTER — Ambulatory Visit: Payer: Managed Care, Other (non HMO) | Admitting: Student

## 2021-10-29 VITALS — BP 104/68 | HR 93 | Temp 98.8°F | Ht 63.0 in | Wt 110.0 lb

## 2021-10-29 DIAGNOSIS — R052 Subacute cough: Secondary | ICD-10-CM | POA: Diagnosis not present

## 2021-10-29 DIAGNOSIS — R0600 Dyspnea, unspecified: Secondary | ICD-10-CM

## 2021-10-29 NOTE — Patient Instructions (Signed)
-   We will schedule PFTs (breathing tests) and then I'll see you in clinic afterward to discuss - Albuterol as needed - would try to stop singulair a week before PFT if you're able but no need to be a hero, can resume and we'll do our best to interpret the results of  your breathing tests

## 2022-01-01 ENCOUNTER — Encounter: Payer: Self-pay | Admitting: Student

## 2022-01-01 ENCOUNTER — Ambulatory Visit (INDEPENDENT_AMBULATORY_CARE_PROVIDER_SITE_OTHER): Payer: Managed Care, Other (non HMO) | Admitting: Student

## 2022-01-01 ENCOUNTER — Ambulatory Visit: Payer: Managed Care, Other (non HMO) | Admitting: Student

## 2022-01-01 VITALS — BP 98/60 | HR 66 | Ht 63.0 in | Wt 115.0 lb

## 2022-01-01 DIAGNOSIS — R052 Subacute cough: Secondary | ICD-10-CM

## 2022-01-01 DIAGNOSIS — R0600 Dyspnea, unspecified: Secondary | ICD-10-CM

## 2022-01-01 LAB — PULMONARY FUNCTION TEST
DL/VA % pred: 103 %
DL/VA: 4.83 ml/min/mmHg/L
DLCO cor % pred: 111 %
DLCO cor: 24.17 ml/min/mmHg
DLCO unc % pred: 111 %
DLCO unc: 24.17 ml/min/mmHg
FEF 25-75 Post: 4.91 L/sec
FEF 25-75 Pre: 3.89 L/sec
FEF2575-%Change-Post: 26 %
FEF2575-%Pred-Post: 143 %
FEF2575-%Pred-Pre: 113 %
FEV1-%Change-Post: 2 %
FEV1-%Pred-Post: 117 %
FEV1-%Pred-Pre: 114 %
FEV1-Post: 3.66 L
FEV1-Pre: 3.57 L
FEV1FVC-%Change-Post: 4 %
FEV1FVC-%Pred-Pre: 102 %
FEV6-%Change-Post: -1 %
FEV6-%Pred-Post: 111 %
FEV6-%Pred-Pre: 113 %
FEV6-Post: 4.05 L
FEV6-Pre: 4.11 L
FEV6FVC-%Pred-Post: 100 %
FEV6FVC-%Pred-Pre: 100 %
FVC-%Change-Post: -1 %
FVC-%Pred-Post: 110 %
FVC-%Pred-Pre: 112 %
FVC-Post: 4.05 L
FVC-Pre: 4.11 L
Post FEV1/FVC ratio: 90 %
Post FEV6/FVC ratio: 100 %
Pre FEV1/FVC ratio: 87 %
Pre FEV6/FVC Ratio: 100 %
RV % pred: 146 %
RV: 1.93 L
TLC % pred: 121 %
TLC: 5.96 L

## 2022-01-01 NOTE — Progress Notes (Signed)
Full PFT performed today. °

## 2022-01-01 NOTE — Patient Instructions (Addendum)
-   Call us or send my chart message if you feel like you need steroid course/consistent use of inhaler, etc

## 2022-01-01 NOTE — Patient Instructions (Signed)
Full PFT performed today. °

## 2022-01-01 NOTE — Progress Notes (Signed)
Synopsis: Referred for chronic cough by Rochel Brome, MD  Subjective:   PATIENT ID: Dana Gonzales GENDER: female DOB: 1990-11-06, MRN: [id number]  Chief Complaint  Patient presents with   Follow-up    PFT done today. Breathing is doing well. She used her albuterol inhaler once in the past wk.    31yF with history of remote opioid use, covid-19 a year ago mild referred for chronic cough  She says since mid June she thought she had a sinus infection and related cough. Had trouble sleeping due to it. 3 rounds of ABX/steroids. She did have good response to steroids. Takes singulair and zyrtec at night and thinks she is much improved. Did kickboxing class and it went fine on Sunday. She has no sinus congestion now. No heartburn/reflux.   Has never had sinus surgery, asthma as a kid. Did have eczema as a kid that required steroid creams probably.    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 Vaping sporadically, MJ till 2015.  She has 2 dogs. No pet birds.   Interval HPI: PFT today technically with hyperinflation but all spirometric values also above 100%  She says she is feeling fine overall. She thought she was wheezing when she went on trip to [place]. Able to go the gym and exercise without limitation.   No cough. No sinus congestion/postnasal drainage.   Otherwise pertinent review of systems is negative.  Past Medical History:  Diagnosis Date   Family history of anesthesia complication    pt's mother has hx. of post-op N/V   Hip deformity, acquired 12/2012   left   History of MRSA infection 2007   face   History of substance abuse ([place])    opiates - has been "clean" x 7 mos., per mother   Kidney stones    Substance abuse ([place])    opiates-past     Family History  Problem Relation Age of Onset   Anesthesia problems Mother        post-op N/V   Thyroid disease Mother    Asthma Mother    Cancer Maternal Grandmother         lung   Diabetes Paternal Grandfather      Past Surgical History:  Procedure Laterality Date   LIPOSUCTION Left 01/01/2013   Procedure: RECONSTRUCTION OF THE LEFT HIP DEFECT WITH LIPOSUCTION ASSISTANCE;  Surgeon: Cristine Polio, MD;  Location: [facility];  Service: Plastics;  Laterality: Left;  Fat injection left hip   TONSILLECTOMY AND ADENOIDECTOMY      Social History   Socioeconomic History   Marital status: Single    Spouse name: Not on file   Number of children: Not on file   Years of education: Not on file   Highest education level: Not on file  Occupational History   Not on file  Tobacco Use   Smoking status: Former    Packs/day: 1.00    Years: 6.00    Total pack years: 6.00    Types: Cigarettes    Quit date: 02/13/2017    Years since quitting: 4.8   Smokeless tobacco: Never  Vaping Use   Vaping Use: Former  Substance and Sexual Activity   Alcohol use: No   Drug use: No    Comment: HX. of substance abuse   Sexual activity: Yes    Partners: Male    Birth control/protection: None  Other Topics Concern   Not on  file  Social History Narrative   Not on file   Social Determinants of Health   Financial Resource Strain: Not on file  Food Insecurity: Not on file  Transportation Needs: Not on file  Physical Activity: Not on file  Stress: Not on file  Social Connections: Not on file  Intimate Partner Violence: Not on file     Allergies  Allergen Reactions   Macrobid [Nitrofurantoin Macrocrystal] Nausea And Vomiting   Ciprofloxacin Hcl Nausea And Vomiting     Outpatient Medications Prior to Visit  Medication Sig Dispense Refill   albuterol (PROVENTIL) (2.5 MG/3ML) 0.083% nebulizer solution Take 2.5 mg by nebulization every 3 (three) hours as needed.     albuterol (VENTOLIN HFA) 108 (90 Base) MCG/ACT inhaler Inhale 2 puffs into the lungs every 4 (four) hours as needed.     cetirizine (ZYRTEC) 10 MG tablet Take 10 mg by mouth daily as  needed for allergies.     levonorgestrel (MIRENA) 20 MCG/DAY IUD 1 each by Intrauterine route once.     montelukast (SINGULAIR) 10 MG tablet Take 10 mg by mouth daily.     Multiple Vitamins-Minerals (MULTIVITAMIN WITH MINERALS) tablet Take 1 tablet by mouth daily.     No facility-administered medications prior to visit.       Objective:   Physical Exam:  General appearance: 31 y.o., female, NAD, conversant  Eyes: anicteric sclerae; PERRL, tracking appropriately HENT: NCAT; MMM Neck: Trachea midline; no lymphadenopathy, no JVD Lungs: CTAB, no crackles, no wheeze, with normal respiratory effort CV: RRR, no murmur  Abdomen: Soft, non-tender; non-distended, BS present  Extremities: No peripheral edema, warm Skin: Normal turgor and texture; no rash Psych: Appropriate affect Neuro: Alert and oriented to person and place, no focal deficit     Vitals:   01/01/22 1513  BP: 98/60  Pulse: 66  SpO2: 96%  Weight: 115 lb (52.2 kg)  Height: 5\' 3"  (1.6 m)    96% on RA BMI Readings from Last 3 Encounters:  01/01/22 20.37 kg/m  10/29/21 19.49 kg/m  09/19/17 27.28 kg/m   Wt Readings from Last 3 Encounters:  01/01/22 115 lb (52.2 kg)  10/29/21 110 lb (49.9 kg)  09/19/17 154 lb (69.9 kg)     CBC    Component Value Date/Time   WBC 21.1 (H) 09/20/2017 0652   RBC 3.60 (L) 09/20/2017 0652   HGB 11.6 (L) 09/20/2017 0652   HCT 33.8 (L) 09/20/2017 0652   PLT 198 09/20/2017 0652   MCV 93.9 09/20/2017 0652   MCH 32.2 09/20/2017 0652   MCHC 34.3 09/20/2017 0652   RDW 13.9 09/20/2017 0652    Chest Imaging: CXR reportedly normal   Pulmonary Functions Testing Results:    Latest Ref Rng & Units 01/01/2022    1:56 PM  PFT Results  FVC-Pre L 4.11  P  FVC-Predicted Pre % 112  P  FVC-Post L 4.05  P  FVC-Predicted Post % 110  P  Pre FEV1/FVC % % 87  P  Post FEV1/FCV % % 90  P  FEV1-Pre L 3.57  P  FEV1-Predicted Pre % 114  P  FEV1-Post L 3.66  P  DLCO uncorrected  ml/min/mmHg 24.17  P  DLCO UNC% % 111  P  DLCO corrected ml/min/mmHg 24.17  P  DLCO COR %Predicted % 111  P  DLVA Predicted % 103  P  TLC L 5.96  P  TLC % Predicted % 121  P  RV % Predicted % 146  P    P Preliminary result   PFT 01/01/22 with hyperinflation but all spirometric values above 100% - feel this just reflects good lung function     Assessment & Plan:   # Episodic dyspnea # Subacute cough Strong response to prednisone, singulair suggestive of asthma alternatively consider covid related small airways disease however pre-post BD spiro without obstruction or BD response.   Plan: - albuterol prn - encouraged to arrange clinic visit with [place] if recurs for consideration of methacholine challenge vs empiric treatment of asthma   Maryjane Hurter, MD [facility] 01/01/2022 5:17 PM

## 2022-06-15 NOTE — Progress Notes (Unsigned)
Synopsis: Referred for chronic cough by No ref. provider found  Subjective:   PATIENT ID: Dana Gonzales GENDER: female DOB: January 22, 1991, MRN: [id number]  No chief complaint on file.  32yF with history of remote opioid use, covid-19 a year ago mild referred for chronic cough  She says since mid June she thought she had a sinus infection and related cough. Had trouble sleeping due to it. 3 rounds of ABX/steroids. She did have good response to steroids. Takes singulair and zyrtec at night and thinks she is much improved. Did kickboxing class and it went fine on Sunday. She has no sinus congestion now. No heartburn/reflux.   Has never had sinus surgery, asthma as a kid. Did have eczema as a kid that required steroid creams probably.    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 Vaping sporadically, MJ till 2015.  She has 2 dogs. No pet birds.   Interval HPI: PFT today technically with hyperinflation but all spirometric values also above 100%  She says she is feeling fine overall. She thought she was wheezing when [place]. Able to go the gym and exercise without limitation.   No cough. No sinus congestion/postnasal drainage.   Otherwise pertinent review of systems is negative.  Past Medical History:  Diagnosis Date   Family history of anesthesia complication    pt's mother has hx. of post-op N/V   Hip deformity, acquired 12/2012   left   History of MRSA infection 2007   face   History of substance abuse ([facility])    opiates - has been "clean" x 7 mos., per mother   Kidney stones    Substance abuse ([facility])    opiates-past     Family History  Problem Relation Age of Onset   Anesthesia problems Mother        post-op N/V   Thyroid disease Mother    Asthma Mother    Cancer Maternal Grandmother        lung   Diabetes Paternal Grandfather      Past Surgical History:  Procedure Laterality Date   LIPOSUCTION  Left 01/01/2013   Procedure: RECONSTRUCTION OF THE LEFT HIP DEFECT WITH LIPOSUCTION ASSISTANCE;  Surgeon: Louisa Second, MD;  Location: [facility];  Service: Plastics;  Laterality: Left;  Fat injection left hip   TONSILLECTOMY AND ADENOIDECTOMY      Social History   Socioeconomic History   Marital status: Single    Spouse name: Not on file   Number of children: Not on file   Years of education: Not on file   Highest education level: Not on file  Occupational History   Not on file  Tobacco Use   Smoking status: Former    Packs/day: 1.00    Years: 6.00    Additional pack years: 0.00    Total pack years: 6.00    Types: Cigarettes    Quit date: 02/13/2017    Years since quitting: 5.3   Smokeless tobacco: Never  Vaping Use   Vaping Use: Former  Substance and Sexual Activity   Alcohol use: No   Drug use: No    Comment: HX. of substance abuse   Sexual activity: Yes    Partners: Male    Birth control/protection: None  Other Topics Concern   Not on file  Social History Narrative   Not on file   Social Determinants of Health  Financial Resource Strain: Not on file  Food Insecurity: Not on file  Transportation Needs: Not on file  Physical Activity: Not on file  Stress: Not on file  Social Connections: Not on file  Intimate Partner Violence: Not on file     Allergies  Allergen Reactions   Macrobid [Nitrofurantoin Macrocrystal] Nausea And Vomiting   Ciprofloxacin Hcl Nausea And Vomiting     Outpatient Medications Prior to Visit  Medication Sig Dispense Refill   albuterol (PROVENTIL) (2.5 MG/3ML) 0.083% nebulizer solution Take 2.5 mg by nebulization every 3 (three) hours as needed.     albuterol (VENTOLIN HFA) 108 (90 Base) MCG/ACT inhaler Inhale 2 puffs into the lungs every 4 (four) hours as needed.     cetirizine (ZYRTEC) 10 MG tablet Take 10 mg by mouth daily as needed for allergies.     levonorgestrel (MIRENA) 20 MCG/DAY IUD 1 each by Intrauterine  route once.     montelukast (SINGULAIR) 10 MG tablet Take 10 mg by mouth daily.     Multiple Vitamins-Minerals (MULTIVITAMIN WITH MINERALS) tablet Take 1 tablet by mouth daily.     No facility-administered medications prior to visit.       Objective:   Physical Exam:  General appearance: 32 y.o., female, NAD, conversant  Eyes: anicteric sclerae; PERRL, tracking appropriately HENT: NCAT; MMM Neck: Trachea midline; no lymphadenopathy, no JVD Lungs: CTAB, no crackles, no wheeze, with normal respiratory effort CV: RRR, no murmur  Abdomen: Soft, non-tender; non-distended, BS present  Extremities: No peripheral edema, warm Skin: Normal turgor and texture; no rash Psych: Appropriate affect Neuro: Alert and oriented to person and place, no focal deficit     There were no vitals filed for this visit.     on RA BMI Readings from Last 3 Encounters:  01/01/22 20.37 kg/m  10/29/21 19.49 kg/m  09/19/17 27.28 kg/m   Wt Readings from Last 3 Encounters:  01/01/22 115 lb (52.2 kg)  10/29/21 110 lb (49.9 kg)  09/19/17 154 lb (69.9 kg)     CBC    Component Value Date/Time   WBC 21.1 (H) 09/20/2017 0652   RBC 3.60 (L) 09/20/2017 0652   HGB 11.6 (L) 09/20/2017 0652   HCT 33.8 (L) 09/20/2017 0652   PLT 198 09/20/2017 0652   MCV 93.9 09/20/2017 0652   MCH 32.2 09/20/2017 0652   MCHC 34.3 09/20/2017 0652   RDW 13.9 09/20/2017 0652    Chest Imaging: CXR reportedly normal   Pulmonary Functions Testing Results:    Latest Ref Rng & Units 01/01/2022    1:56 PM  PFT Results  FVC-Pre L 4.11   FVC-Predicted Pre % 112   FVC-Post L 4.05   FVC-Predicted Post % 110   Pre FEV1/FVC % % 87   Post FEV1/FCV % % 90   FEV1-Pre L 3.57   FEV1-Predicted Pre % 114   FEV1-Post L 3.66   DLCO uncorrected ml/min/mmHg 24.17   DLCO UNC% % 111   DLCO corrected ml/min/mmHg 24.17   DLCO COR %Predicted % 111   DLVA Predicted % 103   TLC L 5.96   TLC % Predicted % 121   RV % Predicted % 146     PFT 01/01/22 with hyperinflation but all spirometric values above 100% - feel this just reflects good lung function     Assessment & Plan:   # Episodic dyspnea # Subacute cough Strong response to prednisone, singulair suggestive of asthma alternatively consider covid related small airways disease however pre-post  BD spiro without obstruction or BD response.   Plan: - albuterol prn - encouraged to arrange clinic visit with [place] if recurs for consideration of methacholine challenge vs empiric treatment of asthma   Omar Person, MD [facility] 06/15/2022 12:51 PM

## 2022-06-17 ENCOUNTER — Ambulatory Visit (INDEPENDENT_AMBULATORY_CARE_PROVIDER_SITE_OTHER): Payer: Managed Care, Other (non HMO) | Admitting: Student

## 2022-06-17 ENCOUNTER — Ambulatory Visit (INDEPENDENT_AMBULATORY_CARE_PROVIDER_SITE_OTHER): Payer: Managed Care, Other (non HMO)

## 2022-06-17 ENCOUNTER — Encounter: Payer: Self-pay | Admitting: Student

## 2022-06-17 ENCOUNTER — Other Ambulatory Visit: Payer: Self-pay | Admitting: Student

## 2022-06-17 VITALS — BP 112/78 | HR 64 | Temp 98.3°F | Ht 63.0 in | Wt 109.4 lb

## 2022-06-17 DIAGNOSIS — R052 Subacute cough: Secondary | ICD-10-CM | POA: Diagnosis not present

## 2022-06-17 LAB — CBC WITH DIFFERENTIAL/PLATELET
Basophils Absolute: 0.1 10*3/uL (ref 0.0–0.1)
Basophils Relative: 0.9 % (ref 0.0–3.0)
Eosinophils Absolute: 1.3 10*3/uL — ABNORMAL HIGH (ref 0.0–0.7)
Eosinophils Relative: 10.5 % — ABNORMAL HIGH (ref 0.0–5.0)
HCT: 41.9 % (ref 36.0–46.0)
Hemoglobin: 13.8 g/dL (ref 12.0–15.0)
Lymphocytes Relative: 29.6 % (ref 12.0–46.0)
Lymphs Abs: 3.6 10*3/uL (ref 0.7–4.0)
MCHC: 32.9 g/dL (ref 30.0–36.0)
MCV: 93.2 fl (ref 78.0–100.0)
Monocytes Absolute: 0.4 10*3/uL (ref 0.1–1.0)
Monocytes Relative: 3.5 % (ref 3.0–12.0)
Neutro Abs: 6.8 10*3/uL (ref 1.4–7.7)
Neutrophils Relative %: 55.5 % (ref 43.0–77.0)
Platelets: 408 10*3/uL — ABNORMAL HIGH (ref 150.0–400.0)
RBC: 4.5 Mil/uL (ref 3.87–5.11)
RDW: 13.1 % (ref 11.5–15.5)
WBC: 12.3 10*3/uL — ABNORMAL HIGH (ref 4.0–10.5)

## 2022-06-17 MED ORDER — BREZTRI AEROSPHERE 160-9-4.8 MCG/ACT IN AERO: 2.0000 | INHALATION_SPRAY | Freq: Two times a day (BID) | RESPIRATORY_TRACT | 0 refills | Status: DC

## 2022-06-17 MED ORDER — BREZTRI AEROSPHERE 160-9-4.8 MCG/ACT IN AERO: 2.0000 | INHALATION_SPRAY | Freq: Two times a day (BID) | RESPIRATORY_TRACT | 11 refills | Status: AC

## 2022-06-17 MED ORDER — ALBUTEROL SULFATE HFA 108 (90 BASE) MCG/ACT IN AERS: 2.0000 | INHALATION_SPRAY | RESPIRATORY_TRACT | 11 refills | Status: AC | PRN

## 2022-06-17 NOTE — Patient Instructions (Addendum)
-   labs, x ray today - breztri 2 puffs twice daily, rinse mouth and brush teeth/tongue after each use. Call back if no better in a couple weeks and we can consider prednisone taper - albuterol as needed - would stop budesonide nebs while on breztri. But would keep supply you have. Can always try adding this if breztri isn't enough to control cough  - see you in 3 months or sooner if need be!

## 2022-06-18 ENCOUNTER — Encounter: Payer: Self-pay | Admitting: Student

## 2022-06-18 ENCOUNTER — Telehealth: Payer: Self-pay | Admitting: Pulmonary Disease

## 2022-06-18 LAB — RESPIRATORY ALLERGY PROFILE REGION II ~~LOC~~
Allergen, Cedar tree, t12: <0.1 kU/L
Allergen, D pternoyssinus,d7: 3.94 kU/L — ABNORMAL HIGH
Allergen, Mouse Urine Protein, e78: <0.1 kU/L
Allergen, P. notatum, m1: <0.1 kU/L
COMMON RAGWEED (SHORT) (W1) IGE: <0.1 kU/L
Class: 0
Class: 0
Class: 0
Class: 0
Class: 0
Class: 0
Class: 0
Class: 0
Johnson Grass: <0.1 kU/L
Pecan/Hickory Tree IgE: <0.1 kU/L
Sheep Sorrel IgE: <0.1 kU/L
Timothy Grass: <0.1 kU/L

## 2022-06-18 LAB — INTERPRETATION:

## 2022-06-18 NOTE — Telephone Encounter (Signed)
Patient would like the nurse to call to go over her blood results.  She stated that some of the numbers were high and she has some questions.  Please advise and call patient at [phone]

## 2022-06-18 NOTE — Telephone Encounter (Signed)
Dr. Thora Lance can you please advise on lab work. Pt is concerned about some of the elevated numbers

## 2022-06-18 NOTE — Telephone Encounter (Signed)
Spoke with patient. Advised message has been sent to Dr. Thora Lance in regards to lab results. I advised we would call next week to go over the results once he has viewed those.  Patient verbalized understanding. nfn

## 2022-06-19 ENCOUNTER — Telehealth: Payer: Self-pay | Admitting: Student

## 2022-06-19 DIAGNOSIS — Z9109 Other allergy status, other than to drugs and biological substances: Secondary | ICD-10-CM

## 2022-06-19 MED ORDER — PREDNISONE 10 MG PO TABS: ORAL_TABLET | ORAL | 0 refills | Status: DC

## 2022-06-19 NOTE — Telephone Encounter (Signed)
Called and discussed lab results including measures for dust mite allergen avoidance. Still feeling pretty crummy. High eosinophil count may be related to underlying AR and/or developing asthma and could be marker of likely steroid responsiveness. Encouraged use of flonase, antihistamine, breztri as directed. Sent in prednisone taper and she may call back if feeling no better. Will place referral to allergy as she is eager to get to the bottom of this cough and exercise limitation and is open to immunotherapy or biologic if felt to be a good candidate.   [facility]

## 2022-06-21 LAB — RESPIRATORY ALLERGY PROFILE REGION II ~~LOC~~
Allergen, A. alternata, m6: <0.1 kU/L
Allergen, Comm Silver Birch, t9: <0.1 kU/L
Allergen, Cottonwood, t14: <0.1 kU/L
Allergen, Mulberry, t76: <0.1 kU/L
Allergen, Oak,t7: 0.71 kU/L — ABNORMAL HIGH
Aspergillus fumigatus, m3: <0.1 kU/L
Bermuda Grass: <0.1 kU/L
Box Elder IgE: <0.1 kU/L
CLADOSPORIUM HERBARUM (M2) IGE: <0.1 kU/L
Cat Dander: <0.1 kU/L
Class: 0
Class: 0
Class: 0
Class: 0
Class: 0
Class: 0
Class: 0
Class: 0
Class: 0
Class: 0
Class: 0
Class: 0
Class: 2
Class: 3
Class: 3
Cockroach: <0.1 kU/L
D. farinae: 10.1 kU/L — ABNORMAL HIGH
Dog Dander: 0.27 kU/L — ABNORMAL HIGH
Elm IgE: <0.1 kU/L
IgE (Immunoglobulin E), Serum: 70 kU/L (ref <=114)
Rough Pigweed  IgE: <0.1 kU/L

## 2022-06-21 LAB — DOG DANDER COMPONENT
Can f 4(e229) IgE: <0.1 kU/L (ref <0.10)
Can f 6(e230) IgE: <0.1 kU/L (ref <0.10)
E101-IgE Can f 1: <0.1 kU/L (ref <0.10)
E102-IgE Can f 2: <0.1 kU/L (ref <0.10)
E221-IgE Can f 3: <0.1 kU/L (ref <0.10)
E226-IgE Can f 5: 0.31 kU/L — ABNORMAL HIGH (ref <0.10)

## 2022-06-21 NOTE — Telephone Encounter (Signed)
See separate encounter

## 2022-06-28 ENCOUNTER — Encounter: Payer: Self-pay | Admitting: Student

## 2022-06-29 ENCOUNTER — Telehealth: Payer: Managed Care, Other (non HMO) | Admitting: Family Medicine

## 2022-06-29 DIAGNOSIS — B9689 Other specified bacterial agents as the cause of diseases classified elsewhere: Secondary | ICD-10-CM | POA: Diagnosis not present

## 2022-06-29 DIAGNOSIS — J019 Acute sinusitis, unspecified: Secondary | ICD-10-CM

## 2022-06-29 MED ORDER — AMOXICILLIN-POT CLAVULANATE 875-125 MG PO TABS
1.0000 | ORAL_TABLET | Freq: Two times a day (BID) | ORAL | 0 refills | Status: AC
Start: 2022-06-29 — End: 2022-07-06

## 2022-06-29 NOTE — Progress Notes (Signed)
Virtual Visit Consent   Dana Gonzales, you are scheduled for a virtual visit with a [facility] provider today. Just as with appointments in the office, your consent must be obtained to participate. Your consent will be active for this visit and any virtual visit you may have with one of our providers in the next 365 days. If you have a MyChart account, a copy of this consent can be sent to you electronically.  As this is a virtual visit, video technology does not allow for your provider to perform a traditional examination. This may limit your provider's ability to fully assess your condition. If your provider identifies any concerns that need to be evaluated in person or the need to arrange testing (such as labs, EKG, etc.), we will make arrangements to do so. Although advances in technology are sophisticated, we cannot ensure that it will always work on either your end or our end. If the connection with a video visit is poor, the visit may have to be switched to a telephone visit. With either a video or telephone visit, we are not always able to ensure that we have a secure connection.  By engaging in this virtual visit, you consent to the provision of healthcare and authorize for your insurance to be billed (if applicable) for the services provided during this visit. Depending on your insurance coverage, you may receive a charge related to this service.  I need to obtain your verbal consent now. Are you willing to proceed with your visit today? Annelisa Whitehill Dineen has provided verbal consent on 06/29/2022 for a virtual visit (video or telephone). Freddy Finner, NP  Date: 06/29/2022 3:49 PM  Virtual Visit via Video Note   I, Freddy Finner, connected with  Dana Gonzales  ([id number], 1990/05/08) on 06/29/22 at  3:45 PM EDT by a video-enabled telemedicine application and verified that I am speaking with the correct person using two identifiers.  Location: Patient: Virtual Visit Location  Patient: Home Provider: Virtual Visit Location Provider: Home Office   I discussed the limitations of evaluation and management by telemedicine and the availability of in person appointments. The patient expressed understanding and agreed to proceed.    History of Present Illness: Dana Gonzales is a 32 y.o. who identifies as a female who was assigned female at birth, and is being seen today for allergies/ sinus infection  Onset was 3-4 days ago after being on pred taper post bronchitis flare- see note in epic from pulm Associated symptoms are sinus pressure, nasal pressure, cough, mucus increased- mild ear pressure Modifying factors are singular, inhalers, zytrec  Denies chest pain, shortness of breath, sore throat, fevers, chills   Problems:  Patient Active Problem List   Diagnosis Date Noted   Indication for care in labor or delivery 09/19/2017   Flank pain 06/22/2017   Dysuria during pregnancy in second trimester 06/22/2017   Hepatitis C antibody test positive 02/06/2013    Allergies:  Allergies  Allergen Reactions   Macrobid [Nitrofurantoin Macrocrystal] Nausea And Vomiting   Ciprofloxacin Hcl Nausea And Vomiting   Medications:  Current Outpatient Medications:    amoxicillin-clavulanate (AUGMENTIN) 875-125 MG tablet, Take 1 tablet by mouth 2 (two) times daily for 7 days., Disp: 14 tablet, Rfl: 0   albuterol (VENTOLIN HFA) 108 (90 Base) MCG/ACT inhaler, Inhale 2 puffs into the lungs every 4 (four) hours as needed., Disp: 1 each, Rfl: 11   Budeson-Glycopyrrol-Formoterol (BREZTRI AEROSPHERE) 160-9-4.8 MCG/ACT AERO, Inhale 2 puffs  into the lungs in the morning and at bedtime., Disp: 1 each, Rfl: 11   Budeson-Glycopyrrol-Formoterol (BREZTRI AEROSPHERE) 160-9-4.8 MCG/ACT AERO, Inhale 2 puffs into the lungs in the morning and at bedtime., Disp: 5.9 g, Rfl: 0   cetirizine (ZYRTEC) 10 MG tablet, Take 10 mg by mouth daily as needed for allergies., Disp: , Rfl:    levonorgestrel  (MIRENA) 20 MCG/DAY IUD, 1 each by Intrauterine route once., Disp: , Rfl:    montelukast (SINGULAIR) 10 MG tablet, Take 10 mg by mouth daily., Disp: , Rfl:    Multiple Vitamins-Minerals (MULTIVITAMIN WITH MINERALS) tablet, Take 1 tablet by mouth daily., Disp: , Rfl:   Observations/Objective: Patient is well-developed, well-nourished in no acute distress.  Resting comfortably  at home.  Head is normocephalic, atraumatic.  No labored breathing.  Speech is clear and coherent with logical content.  Patient is alert and oriented at baseline.  Cough and nasal tone noted  Assessment and Plan: 1. Acute bacterial sinusitis - amoxicillin-clavulanate (AUGMENTIN) 875-125 MG tablet; Take 1 tablet by mouth 2 (two) times daily for 7 days.  Dispense: 14 tablet; Refill: 0   -Take meds as prescribed -Nasacort or Flonase  -Rest -Use a cool mist humidifier especially during the winter months when heat dries out the air. - Use saline nose sprays frequently to help soothe nasal passages and promote drainage. -Saline irrigations of the nose can be very helpful if done frequently.             * 4X daily for 1 week*             * Use of a nettie pot can be helpful with this.  *Follow directions with this* *Boiled or distilled water only -stay hydrated by drinking plenty of fluids - Keep thermostat turn down low to prevent drying out sinuses -Mucinex if needed   If you do not improve you will need a follow up visit in person.               Reviewed side effects, risks and benefits of medication.    Patient acknowledged agreement and understanding of the plan.   Past Medical, Surgical, Social History, Allergies, and Medications have been Reviewed.    Follow Up Instructions: I discussed the assessment and treatment plan with the patient. The patient was provided an opportunity to ask questions and all were answered. The patient agreed with the plan and demonstrated an understanding of the  instructions.  A copy of instructions were sent to the patient via MyChart unless otherwise noted below.    The patient was advised to call back or seek an in-person evaluation if the symptoms worsen or if the condition fails to improve as anticipated.  Time:  I spent 10 minutes with the patient via telehealth technology discussing the above problems/concerns.    Freddy Finner, NP

## 2022-06-29 NOTE — Patient Instructions (Signed)
[facility] Sample[facility], thank you for joining Freddy Finner, NP for today's virtual visit.  While this provider is not your primary care provider (PCP), if your PCP is located in our provider database this encounter information will be shared with them immediately following your visit.   A [facility] MyChart account gives you access to today's visit and all your visits, tests, and labs performed at [facility] " click here if you don't have a [facility] MyChart account or go to [email or web address]  Consent: (Patient) Dana Gonzales provided verbal consent for this virtual visit at the beginning of the encounter.  Current Medications:  Current Outpatient Medications:    amoxicillin-clavulanate (AUGMENTIN) 875-125 MG tablet, Take 1 tablet by mouth 2 (two) times daily for 7 days., Disp: 14 tablet, Rfl: 0   albuterol (VENTOLIN HFA) 108 (90 Base) MCG/ACT inhaler, Inhale 2 puffs into the lungs every 4 (four) hours as needed., Disp: 1 each, Rfl: 11   Budeson-Glycopyrrol-Formoterol (BREZTRI AEROSPHERE) 160-9-4.8 MCG/ACT AERO, Inhale 2 puffs into the lungs in the morning and at bedtime., Disp: 1 each, Rfl: 11   Budeson-Glycopyrrol-Formoterol (BREZTRI AEROSPHERE) 160-9-4.8 MCG/ACT AERO, Inhale 2 puffs into the lungs in the morning and at bedtime., Disp: 5.9 g, Rfl: 0   cetirizine (ZYRTEC) 10 MG tablet, Take 10 mg by mouth daily as needed for allergies., Disp: , Rfl:    levonorgestrel (MIRENA) 20 MCG/DAY IUD, 1 each by Intrauterine route once., Disp: , Rfl:    montelukast (SINGULAIR) 10 MG tablet, Take 10 mg by mouth daily., Disp: , Rfl:    Multiple Vitamins-Minerals (MULTIVITAMIN WITH MINERALS) tablet, Take 1 tablet by mouth daily., Disp: , Rfl:    Medications ordered in this encounter:  Meds ordered this encounter  Medications   amoxicillin-clavulanate (AUGMENTIN) 875-125 MG tablet    Sig: Take 1 tablet by mouth 2 (two) times daily for 7 days.    Dispense:  14 tablet    Refill:   0    Order Specific Question:   Supervising Provider    Answer:   Merrilee Jansky [id number]     *If you need refills on other medications prior to your next appointment, please contact your pharmacy*  Follow-Up: Call back or seek an in-person evaluation if the symptoms worsen or if the condition fails to improve as anticipated.   Virtual Care [phone]  Other Instructions   Allergies can cause a lot of symptoms: watery, itching eyes, runny nose (clear), sneezing, sinus pressure, and headaches. This is not making you contagious to others. You can not spread to others or catch this from others. These symptoms happen after you have been exposed to something that you are allergic to an allergen. Prevention: The best prevention is to avoid the things that you know you are allergic to, for example smoke (cigarette, cigar, wood); pollens and molds; animal dander; dust mites. And indoor inhalants such as cleaning products or aerosol sprays.  Target your bedroom as allergy free by removing carpets, damp mopping floors weekly, hanging washable curtains instead of blinds, removing books and stuffed animals, using foam pillows, and encasing pillows and mattress in plastic. Do not blow your nose too frequently or too hard. It may cause your eardrum to perforate (tear). Blow through both nostrils at the same time to equalize pressure.  Use tissue when you blow your nose. Dispose of them and then wash your hands. If no tissue is available, do the "elbow sneeze" into the bend of your  arm (away from your open hands). Always wash your hands.  If able use the [facility] in the house and car to reduce exposure to pollens. Use an air filtration system in your house or buy a small one for your bedroom. Dust your house often, using a cloth and cleaner or polish that keeps the dust from flying into the air. Allergy testing can be done if you have had allergies for a long time and are not doing well on current  treatments. Take your medications as directed. If you find the medications are not working let your healthcare provider know. It might take more than one medication to control allergies, especially, seasonal ones.       If you have been instructed to have an in-person evaluation today at a local Urgent Care facility, please use the link below. It will take you to a list of all of our available [facility], including address, phone number and hours of operation. Please do not delay care.  [facility]  If you or a family member do not have a primary care provider, use the link below to schedule a visit and establish care. When you choose a [facility] primary care physician or advanced practice provider, you gain a long-term partner in health. Find a Primary Care Provider  Learn more about [facility]'s in-office and virtual care options: [facility] - Get Care Now

## 2022-07-27 ENCOUNTER — Encounter: Payer: Self-pay | Admitting: Allergy & Immunology

## 2022-07-27 ENCOUNTER — Ambulatory Visit (INDEPENDENT_AMBULATORY_CARE_PROVIDER_SITE_OTHER): Payer: Managed Care, Other (non HMO) | Admitting: Allergy & Immunology

## 2022-07-27 ENCOUNTER — Other Ambulatory Visit: Payer: Self-pay

## 2022-07-27 VITALS — BP 118/76 | HR 72 | Temp 98.2°F | Resp 16 | Ht 62.5 in | Wt 111.4 lb

## 2022-07-27 DIAGNOSIS — J302 Other seasonal allergic rhinitis: Secondary | ICD-10-CM

## 2022-07-27 DIAGNOSIS — J3089 Other allergic rhinitis: Secondary | ICD-10-CM

## 2022-07-27 DIAGNOSIS — J4530 Mild persistent asthma, uncomplicated: Secondary | ICD-10-CM | POA: Diagnosis not present

## 2022-07-27 NOTE — Progress Notes (Signed)
NEW PATIENT  Date of Service/Encounter:  07/27/22  Consult requested by: Jim Like, NP   Assessment:   Mild persistent asthma, uncomplicated - seemingly  well controlled with PRN use of Breztri  Seasonal and perennial allergic rhinitis (trees, dust mites, dog) - consider intradermal testing in the future to get a more clear picture  Plan/Recommendations:   1. Mild persistent asthma, uncomplicated - Lung testing looks good today. - Spacer use reviewed. - Daily controller medication(s): Breztri one puff once daily with spacer and Singulair (montelukast) - Prior to physical activity: albuterol 2 puffs 10-15 minutes before physical activity. - Rescue medications: albuterol 4 puffs every 4-6 hours as needed - Changes during respiratory infections or worsening symptoms: Increase Breztri to 2 puffs twice daily for TWO WEEKS. - Asthma control goals:  * Full participation in all desired activities (may need albuterol before activity) * Albuterol use two time or less a week on average (not counting use with activity) * Cough interfering with sleep two time or less a month * Oral steroids no more than once a year * No hospitalizations  2. Seasonal and perennial allergic rhinitis (trees, dust mites, dog) - I think we can hold off on further work up for now.  - We can be more aggressive with testing in the future if needed. - Continue with Singulair (montelukast) 10mg  once daily. - You can use the Flonase as needed.   3. Return in about 3 months (around 10/27/2022). You can have the follow up appointment with Dr. Dellis Anes or a Nurse Practicioner (our Nurse Practitioners are excellent and always have Physician oversight!).    This note in its entirety was forwarded to the Provider who requested this consultation.  Subjective:   Dana Gonzales is a 32 y.o. female presenting today for evaluation of  Chief Complaint  Patient presents with   Allergic Rhinitis     Says she did  an allergy blood test that confirmed that she had a pollen and dust mites allergy. Labs were completed about two months ago. Same issues last year as well.    Cough    Says she has a constant cough    Dana Gonzales has a history of the following: Patient Active Problem List   Diagnosis Date Noted   Indication for care in labor or delivery 09/19/2017   Flank pain 06/22/2017   Dysuria during pregnancy in second trimester 06/22/2017   Hepatitis C antibody test positive 02/06/2013    History obtained from: chart review and patient.  Dana Gonzales was referred by Jim Like, NP.     Dana Gonzales is a 32 y.o. female presenting for an evaluation of environmental allergies .   Asthma/Respiratory Symptom History: She sees Dr. Thora Lance. She started seeing him a year ago and developed a hacking cough. She was a smoker until 2018 and hse has been acitve since that time. This was constant and she was not able to make it go away. She went to see her PCP and she had wheezing. She went to see the Pulmonologist in September 2023. She was quiescent for a few months and then the coughing started again in April 2024.  She is currently on Breztri two puffs BID, but she has slacked off.   Her worst months are April through June and she improved with steroids and antibiotics. This all kicked off when we were exposed to the [employer]. She has only had one round of steroids in 2024. Last  round of steroids was a 10-day supply of this.   Allergic Rhinitis Symptom History: She had environmental allergy testing that showed dust mites, dog, and oak pollen. She has two dogs that she does not want to get rid of. They do sleep in her bed. She did do dust mite covers and she washes her sheets regularly. She made all of these changes and everything improved.  She was taking montelukast. But she did stop it for the appointment. She does not feel any different without it.   As a child, she had just some snotty  nose and rhinorrhea. She might have had some sneezing. But her symptoms were never this bad by any means.   Otherwise, there is no history of other atopic diseases, including drug allergies, stinging insect allergies, or contact dermatitis. There is no significant infectious history. Vaccinations are up to date.    Past Medical History: Patient Active Problem List   Diagnosis Date Noted   Indication for care in labor or delivery 09/19/2017   Flank pain 06/22/2017   Dysuria during pregnancy in second trimester 06/22/2017   Hepatitis C antibody test positive 02/06/2013    Medication List:  Allergies as of 07/27/2022       Reactions   Ciprofloxacin Hcl Nausea And Vomiting   Macrobid [nitrofurantoin Macrocrystal] Nausea And Vomiting        Medication List        Accurate as of July 27, 2022 11:59 PM. If you have any questions, ask your nurse or doctor.          STOP taking these medications    cetirizine 10 MG tablet Commonly known as: ZYRTEC Stopped by: Alfonse Spruce, MD       TAKE these medications    albuterol 108 (90 Base) MCG/ACT inhaler Commonly known as: VENTOLIN HFA Inhale 2 puffs into the lungs every 4 (four) hours as needed.   Breztri Aerosphere 160-9-4.8 MCG/ACT Aero Generic drug: Budeson-Glycopyrrol-Formoterol Inhale 2 puffs into the lungs in the morning and at bedtime.   levonorgestrel 20 MCG/DAY Iud Commonly known as: MIRENA 1 each by Intrauterine route once.   montelukast 10 MG tablet Commonly known as: SINGULAIR Take 10 mg by mouth daily.   multivitamin with minerals tablet Take 1 tablet by mouth daily.        Birth History: non-contributory  Developmental History: non-contributory  Past Surgical History: Past Surgical History:  Procedure Laterality Date   LIPOSUCTION Left 01/01/2013   Procedure: RECONSTRUCTION OF THE LEFT HIP DEFECT WITH LIPOSUCTION ASSISTANCE;  Surgeon: Louisa Second, MD;  Location: [facility];  Service: Plastics;  Laterality: Left;  Fat injection left hip   TONSILLECTOMY AND ADENOIDECTOMY  2010     Family History: Family History  Problem Relation Age of Onset   Allergic rhinitis Mother    Anesthesia problems Mother        post-op N/V   Thyroid disease Mother    Asthma Mother    Cancer Maternal Grandmother        lung   Diabetes Paternal Grandfather      Social History: Amry lives at home with her husband.  She lives in a house.  There is electric vinyl plank and rugs throughout the home.  There is carpeting in the bedroom.  They have electric heating and central cooling.  There are 2 dogs in the home.  There are dust mite covers on the bedding.  There is no tobacco exposure.  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  There is no fume, chemical, or dust exposure.  They do not have a HEPA filter in the home.  She was a smoker from 2009 through 2019, but she has stopped and has not picked them up again.   Review of Systems  Constitutional: Negative.  Negative for chills, fever, malaise/fatigue and weight loss.  HENT: Negative.  Negative for congestion, ear discharge, ear pain and sinus pain.   Eyes:  Negative for pain, discharge and redness.  Respiratory:  Negative for cough, sputum production, shortness of breath and wheezing.   Cardiovascular: Negative.  Negative for chest pain and palpitations.  Gastrointestinal:  Negative for abdominal pain, heartburn, nausea and vomiting.  Skin: Negative.  Negative for itching and rash.  Neurological:  Negative for dizziness and headaches.  Endo/Heme/Allergies:  Positive for environmental allergies. Does not bruise/bleed easily.       Objective:   Blood pressure 118/76, pulse 72, temperature 98.2 F (36.8 C), temperature source Temporal, resp. rate 16, height 5' 2.5" (1.588 m), weight 111 lb 6.4 oz (50.5 kg), SpO2 98 %, unknown if currently breastfeeding. Body mass index is 20.05  kg/m.     Physical Exam Vitals reviewed.  Constitutional:      Appearance: She is well-developed.     Comments: Pleasant and talkative.  HENT:     Head: Normocephalic and atraumatic.     Right Ear: Tympanic membrane, ear canal and external ear normal. No drainage, swelling or tenderness. Tympanic membrane is not injected, scarred, erythematous, retracted or bulging.     Left Ear: Tympanic membrane, ear canal and external ear normal. No drainage, swelling or tenderness. Tympanic membrane is not injected, scarred, erythematous, retracted or bulging.     Nose: No nasal deformity, septal deviation, mucosal edema or rhinorrhea.     Right Turbinates: Enlarged, swollen and pale.     Left Turbinates: Enlarged, swollen and pale.     Right Sinus: No maxillary sinus tenderness or frontal sinus tenderness.     Left Sinus: No maxillary sinus tenderness or frontal sinus tenderness.     Mouth/Throat:     Mouth: Mucous membranes are not pale and not dry.     Pharynx: Uvula midline.  Eyes:     General:        Right eye: No discharge.        Left eye: No discharge.     Conjunctiva/sclera: Conjunctivae normal.     Right eye: Right conjunctiva is not injected. No chemosis.    Left eye: Left conjunctiva is not injected. No chemosis.    Pupils: Pupils are equal, round, and reactive to light.  Cardiovascular:     Rate and Rhythm: Normal rate and regular rhythm.     Heart sounds: Normal heart sounds.  Pulmonary:     Effort: Pulmonary effort is normal. No tachypnea, accessory muscle usage or respiratory distress.     Breath sounds: Normal breath sounds. No wheezing, rhonchi or rales.  Chest:     Chest wall: No tenderness.  Abdominal:     Tenderness: There is no abdominal tenderness. There is no guarding or rebound.  Lymphadenopathy:     Head:     Right side of head: No submandibular, tonsillar or occipital adenopathy.     Left side of head: No submandibular, tonsillar or occipital adenopathy.      Cervical: No cervical adenopathy.  Skin:    Coloration: Skin is not  pale.     Findings: No abrasion, erythema, petechiae or rash. Rash is not papular, urticarial or vesicular.  Neurological:     Mental Status: She is alert.  Psychiatric:        Behavior: Behavior is cooperative.      Diagnostic studies:    Spirometry: results normal (FEV1: 3.49/116%, FVC: 4.09/116%, FEV1/FVC: 85%).    Spirometry consistent with normal pattern.    Allergy Studies: none           Malachi Bonds, MD [facility]

## 2022-07-27 NOTE — Patient Instructions (Addendum)
1. Mild persistent asthma, uncomplicated - Lung testing looks good today. - Spacer use reviewed. - Daily controller medication(s): Breztri one puff once daily with spacer and Singulair (montelukast) - Prior to physical activity: albuterol 2 puffs 10-15 minutes before physical activity. - Rescue medications: albuterol 4 puffs every 4-6 hours as needed - Changes during respiratory infections or worsening symptoms: Increase Breztri to 2 puffs twice daily for TWO WEEKS. - Asthma control goals:  * Full participation in all desired activities (may need albuterol before activity) * Albuterol use two time or less a week on average (not counting use with activity) * Cough interfering with sleep two time or less a month * Oral steroids no more than once a year * No hospitalizations  2. Seasonal and perennial allergic rhinitis (trees, dust mites, dog) - I think we can hold off on further work up for now.  - We can be more aggressive with testing in the future if needed. - Continue with Singulair (montelukast) 10mg  once daily. - You can use the Flonase as needed.   3. Return in about 3 months (around 10/27/2022). You can have the follow up appointment with Dr. Dellis Anes or a Nurse Practicioner (our Nurse Practitioners are excellent and always have Physician oversight!).    Please inform us of any Emergency Department visits, hospitalizations, or changes in symptoms. Call us before going to the ED for breathing or allergy symptoms since we might be able to fit you in for a sick visit. Feel free to contact us anytime with any questions, problems, or concerns.  It was a pleasure to meet you today!  Websites that have reliable patient information: 1. American Academy of Asthma, Allergy, and Immunology: www.aaaai.org 2. Food Allergy Research and Education (FARE): foodallergy.org 3. Mothers of Asthmatics: http://www.asthmacommunitynetwork.org 4. American College of Allergy, Asthma, and Immunology:  www.acaai.org   COVID-19 Vaccine Information can be found at: [email or web address] For questions related to vaccine distribution or appointments, please email [email or web address] or call [phone].   We realize that you might be concerned about having an allergic reaction to the COVID19 vaccines. To help with that concern, WE ARE OFFERING THE COVID19 VACCINES IN OUR OFFICE! Ask the front desk for dates!     "Like" [facility] on Facebook and Instagram for our latest updates!      A healthy democracy works best when [employer] participate! Make sure you are registered to vote! If you have moved or changed any of your contact information, you will need to get this updated before voting!  In some cases, you MAY be able to register to vote online: [email or web address]        Control of Dust Mite Allergen    Dust mites play a major role in allergic asthma and rhinitis.  They occur in environments with high humidity wherever human skin is found.  Dust mites absorb humidity from the atmosphere (ie, they do not drink) and feed on organic matter (including shed human and animal skin).  Dust mites are a microscopic type of insect that you cannot see with the naked eye.  High levels of dust mites have been detected from mattresses, pillows, carpets, upholstered furniture, bed covers, clothes, soft toys and any woven material.  The principal allergen of the dust mite is found in its feces.  A gram of dust may contain 1,000 mites and 250,000 fecal particles.  Mite antigen is easily measured in the air during house cleaning activities.  Dust mites do not bite and do not cause  harm to humans, other than by triggering allergies/asthma.    Ways to decrease your exposure to dust mites in your home:  Encase mattresses, box springs and pillows with a mite-impermeable barrier or cover   Wash sheets, blankets and drapes weekly  in hot water (130 F) with detergent and dry them in a dryer on the hot setting.  Have the room cleaned frequently with a vacuum cleaner and a damp dust-mop.  For carpeting or rugs, vacuuming with a vacuum cleaner equipped with a high-efficiency particulate air (HEPA) filter.  The dust mite allergic individual should not be in a room which is being cleaned and should wait 1 hour after cleaning before going into the room. Do not sleep on upholstered furniture (eg, couches).   If possible removing carpeting, upholstered furniture and drapery from the home is ideal.  Horizontal blinds should be eliminated in the rooms where the person spends the most time (bedroom, study, television room).  Washable vinyl, roller-type shades are optimal. Remove all non-washable stuffed toys from the bedroom.  Wash stuffed toys weekly like sheets and blankets above.   Reduce indoor humidity to less than 50%.  Inexpensive humidity monitors can be purchased at most hardware stores.  Do not use a humidifier as can make the problem worse and are not recommended.  Reducing Pollen Exposure  The American Academy of Allergy, Asthma and Immunology suggests the following steps to reduce your exposure to pollen during allergy seasons.    Do not hang sheets or clothing out to dry; pollen may collect on these items. Do not mow lawns or spend time around freshly cut grass; mowing stirs up pollen. Keep windows closed at night.  Keep car windows closed while driving. Minimize morning activities outdoors, a time when pollen counts are usually at their highest. Stay indoors as much as possible when pollen counts or humidity is high and on windy days when pollen tends to remain in the air longer. Use air conditioning when possible.  Many air conditioners have filters that trap the pollen spores. Use a HEPA room air filter to remove pollen form the indoor air you breathe.  Control of Dog or Cat Allergen  Avoidance is the best way to  manage a dog or cat allergy. If you have a dog or cat and are allergic to dog or cats, consider removing the dog or cat from the home. If you have a dog or cat but don't want to find it a new home, or if your family wants a pet even though someone in the household is allergic, here are some strategies that may help keep symptoms at bay:  Keep the pet out of your bedroom and restrict it to only a few rooms. Be advised that keeping the dog or cat in only one room will not limit the allergens to that room. Don't pet, hug or kiss the dog or cat; if you do, wash your hands with soap and water. High-efficiency particulate air (HEPA) cleaners run continuously in a bedroom or living room can reduce allergen levels over time. Regular use of a high-efficiency vacuum cleaner or a central vacuum can reduce allergen levels. Giving your dog or cat a bath at least once a week can reduce airborne allergen.

## 2022-07-28 ENCOUNTER — Encounter: Payer: Self-pay | Admitting: Allergy & Immunology

## 2022-07-28 NOTE — Addendum Note (Signed)
Addended by: Philipp Deputy on: 07/28/2022 11:59 AM   Modules accepted: Orders

## 2022-08-05 ENCOUNTER — Ambulatory Visit: Payer: Self-pay | Admitting: Allergy & Immunology

## 2022-09-16 ENCOUNTER — Ambulatory Visit: Payer: Managed Care, Other (non HMO) | Admitting: Pulmonary Disease

## 2022-10-28 ENCOUNTER — Ambulatory Visit: Payer: Managed Care, Other (non HMO) | Admitting: Allergy & Immunology
